# Patient Record
Sex: Female | Born: 1955 | Race: Black or African American | Hispanic: No | Marital: Married | Smoking: Never smoker
Health system: Southern US, Community
[De-identification: ages and names within clinical notes are randomized; demographics above are authoritative.]

## PROBLEM LIST (undated history)

## (undated) DIAGNOSIS — I10 Essential (primary) hypertension: Secondary | ICD-10-CM

## (undated) DIAGNOSIS — E7800 Pure hypercholesterolemia, unspecified: Secondary | ICD-10-CM

## (undated) DIAGNOSIS — K219 Gastro-esophageal reflux disease without esophagitis: Secondary | ICD-10-CM

## (undated) DIAGNOSIS — I639 Cerebral infarction, unspecified: Secondary | ICD-10-CM

## (undated) HISTORY — PX: COLON SURGERY: SHX602

## (undated) HISTORY — PX: COLECTOMY: SHX59

## (undated) HISTORY — PX: BREAST SURGERY: SHX581

## (undated) HISTORY — PX: ABDOMINAL HYSTERECTOMY: SHX81

---

## 2011-06-22 ENCOUNTER — Emergency Department (INDEPENDENT_AMBULATORY_CARE_PROVIDER_SITE_OTHER): Payer: Federal, State, Local not specified - PPO

## 2011-06-22 ENCOUNTER — Emergency Department (HOSPITAL_BASED_OUTPATIENT_CLINIC_OR_DEPARTMENT_OTHER)
Admission: EM | Admit: 2011-06-22 | Discharge: 2011-06-22 | Disposition: A | Discharge status: Home / Self Care | Payer: Federal, State, Local not specified - PPO | Attending: Emergency Medicine | Admitting: Emergency Medicine

## 2011-06-22 DIAGNOSIS — E7800 Pure hypercholesterolemia, unspecified: Secondary | ICD-10-CM | POA: Insufficient documentation

## 2011-06-22 DIAGNOSIS — M79609 Pain in unspecified limb: Secondary | ICD-10-CM

## 2011-06-22 DIAGNOSIS — S90129A Contusion of unspecified lesser toe(s) without damage to nail, initial encounter: Secondary | ICD-10-CM

## 2011-06-22 DIAGNOSIS — E119 Type 2 diabetes mellitus without complications: Secondary | ICD-10-CM | POA: Insufficient documentation

## 2011-06-22 DIAGNOSIS — K219 Gastro-esophageal reflux disease without esophagitis: Secondary | ICD-10-CM | POA: Insufficient documentation

## 2011-06-22 DIAGNOSIS — I10 Essential (primary) hypertension: Secondary | ICD-10-CM | POA: Insufficient documentation

## 2011-06-22 DIAGNOSIS — M7730 Calcaneal spur, unspecified foot: Secondary | ICD-10-CM

## 2011-06-22 DIAGNOSIS — W2203XA Walked into furniture, initial encounter: Secondary | ICD-10-CM

## 2011-06-22 DIAGNOSIS — W2209XA Striking against other stationary object, initial encounter: Secondary | ICD-10-CM | POA: Insufficient documentation

## 2011-06-22 HISTORY — DX: Gastro-esophageal reflux disease without esophagitis: K21.9

## 2011-06-22 HISTORY — DX: Essential (primary) hypertension: I10

## 2011-06-22 HISTORY — DX: Pure hypercholesterolemia, unspecified: E78.00

## 2011-06-22 NOTE — ED Provider Notes (Signed)
History     CSN: [id number] Arrival date & time: 06/22/2011  9:04 PM  Chief Complaint  Patient presents with  . Toe Injury   Patient is a 55 y.o. female presenting with toe pain.  Toe Pain This is a new problem. The current episode started today. The problem occurs constantly. The problem has been unchanged. Associated symptoms include joint swelling. The symptoms are aggravated by nothing. She has tried nothing for the symptoms. The treatment provided moderate relief.  Pt complains of pain to right 4th toe.  Pt reports she hit toe.  Pt complains of bruising and pain.  Past Medical History  Diagnosis Date  . Diabetes mellitus   . Hypertension   . GERD (gastroesophageal reflux disease)   . High cholesterol     Past Surgical History  Procedure Date  . Colon surgery   . Abdominal hysterectomy   . Colectomy   . Breast surgery   . Cesarean section     No family history on file.  History  Substance Use Topics  . Smoking status: Never Smoker   . Smokeless tobacco: Not on file  . Alcohol Use: No    OB History    Grav Para Term Preterm Abortions TAB SAB Ect Mult Living                  Review of Systems  Musculoskeletal: Positive for joint swelling.  All other systems reviewed and are negative.    Physical Exam  BP 142/73  Pulse 102  Temp 98.2 F (36.8 C)  Resp 18  Ht 5\' 5"  (1.651 m)  Wt 199 lb (90.266 kg)  BMI 33.12 kg/m2  SpO2 98%  Physical Exam  Nursing note and vitals reviewed. Constitutional: She appears well-developed.  HENT:  Head: Normocephalic.  Neurological: She is alert.  Skin: There is erythema.  Psychiatric: She has a normal mood and affect.    ED Course  Procedures  MDM  No fx,  Post op shoe,        Langston Masker, [place] 06/22/11 2250

## 2011-06-22 NOTE — ED Notes (Signed)
Stubbed right 4th toe this am

## 2011-06-24 NOTE — ED Provider Notes (Signed)
History/physical exam/procedure(s) were performed by non-physician practitioner and as supervising physician I was immediately available for consultation/collaboration. I have reviewed all notes and am in agreement with care and plan.  Hilario Quarry, MD 06/24/11 [phone]

## 2017-08-02 ENCOUNTER — Encounter (HOSPITAL_BASED_OUTPATIENT_CLINIC_OR_DEPARTMENT_OTHER): Payer: Self-pay | Admitting: *Deleted

## 2017-08-02 ENCOUNTER — Emergency Department (HOSPITAL_BASED_OUTPATIENT_CLINIC_OR_DEPARTMENT_OTHER)
Admission: EM | Admit: 2017-08-02 | Discharge: 2017-08-02 | Disposition: A | Discharge status: Home / Self Care | Payer: Federal, State, Local not specified - PPO | Attending: Emergency Medicine | Admitting: Emergency Medicine

## 2017-08-02 ENCOUNTER — Emergency Department (HOSPITAL_BASED_OUTPATIENT_CLINIC_OR_DEPARTMENT_OTHER): Payer: Federal, State, Local not specified - PPO

## 2017-08-02 DIAGNOSIS — Z794 Long term (current) use of insulin: Secondary | ICD-10-CM | POA: Diagnosis not present

## 2017-08-02 DIAGNOSIS — R42 Dizziness and giddiness: Secondary | ICD-10-CM

## 2017-08-02 DIAGNOSIS — I10 Essential (primary) hypertension: Secondary | ICD-10-CM | POA: Insufficient documentation

## 2017-08-02 DIAGNOSIS — Z7982 Long term (current) use of aspirin: Secondary | ICD-10-CM | POA: Diagnosis not present

## 2017-08-02 DIAGNOSIS — Z8673 Personal history of transient ischemic attack (TIA), and cerebral infarction without residual deficits: Secondary | ICD-10-CM | POA: Diagnosis not present

## 2017-08-02 DIAGNOSIS — Z7902 Long term (current) use of antithrombotics/antiplatelets: Secondary | ICD-10-CM | POA: Insufficient documentation

## 2017-08-02 DIAGNOSIS — Z79899 Other long term (current) drug therapy: Secondary | ICD-10-CM | POA: Insufficient documentation

## 2017-08-02 DIAGNOSIS — E119 Type 2 diabetes mellitus without complications: Secondary | ICD-10-CM | POA: Insufficient documentation

## 2017-08-02 HISTORY — DX: Cerebral infarction, unspecified: I63.9

## 2017-08-02 LAB — CBC WITH DIFFERENTIAL/PLATELET
BASOS ABS: 0 10*3/uL (ref 0.0–0.1)
Basophils Relative: 0 %
EOS PCT: 4 %
Eosinophils Absolute: 0.3 10*3/uL (ref 0.0–0.7)
HEMATOCRIT: 40.3 % (ref 36.0–46.0)
HEMOGLOBIN: 12.8 g/dL (ref 12.0–15.0)
LYMPHS ABS: 3 10*3/uL (ref 0.7–4.0)
LYMPHS PCT: 48 %
MCH: 27.5 pg (ref 26.0–34.0)
MCHC: 31.8 g/dL (ref 30.0–36.0)
MCV: 86.7 fL (ref 78.0–100.0)
MONOS PCT: 10 %
Monocytes Absolute: 0.6 10*3/uL (ref 0.1–1.0)
Neutro Abs: 2.4 10*3/uL (ref 1.7–7.7)
Neutrophils Relative %: 38 %
Platelets: 211 10*3/uL (ref 150–400)
RBC: 4.65 MIL/uL (ref 3.87–5.11)
RDW: 14.3 % (ref 11.5–15.5)
WBC: 6.2 10*3/uL (ref 4.0–10.5)

## 2017-08-02 LAB — BASIC METABOLIC PANEL
ANION GAP: 6 (ref 5–15)
BUN: 11 mg/dL (ref 6–20)
CALCIUM: 9.2 mg/dL (ref 8.9–10.3)
CHLORIDE: 106 mmol/L (ref 101–111)
CO2: 26 mmol/L (ref 22–32)
Creatinine, Ser: 0.61 mg/dL (ref 0.44–1.00)
GFR calc non Af Amer: >60 mL/min (ref >60)
Glucose, Bld: 161 mg/dL — ABNORMAL HIGH (ref 65–99)
Potassium: 3.4 mmol/L — ABNORMAL LOW (ref 3.5–5.1)
Sodium: 138 mmol/L (ref 135–145)

## 2017-08-02 MED ORDER — IBUPROFEN 400 MG PO TABS: 400.0000 mg | ORAL_TABLET | Freq: Four times a day (QID) | ORAL | 0 refills | Status: DC | PRN

## 2017-08-02 MED ORDER — MECLIZINE HCL 25 MG PO TABS: 25.0000 mg | ORAL_TABLET | Freq: Three times a day (TID) | ORAL | 0 refills | Status: DC | PRN

## 2017-08-02 NOTE — ED Triage Notes (Addendum)
Dizziness for a week. Head pressure off and on x 3 weeks. States it reminds her of same pressure she had years ago after repressed anger.

## 2017-08-02 NOTE — ED Provider Notes (Signed)
[facility] EMERGENCY DEPARTMENT Provider Note   CSN: [id number] Arrival date & time: 08/02/17  9562   History   Chief Complaint Chief Complaint  Patient presents with  . Dizziness    HPI Quintana Canelo is a 61 y.o. female.  HPI Sx started last Wednesday after getting up from yoga class.  She started to become dizzy.   It increases with certain movements.  She also has had a tightness in her head for the last several weeks. It feels like someone is holding her hair.  Pt has history of a prior "blood clot" in her head so she became concerned.  She tried some tylenol today when the tightness increased so she came to the ED. She has noticed some muffled hearing as well.   Past Medical History:  Diagnosis Date  . Diabetes mellitus   . GERD (gastroesophageal reflux disease)   . High cholesterol   . Hypertension   . Stroke [facility])     There are no active problems to display for this patient.   Past Surgical History:  Procedure Laterality Date  . ABDOMINAL HYSTERECTOMY    . BREAST SURGERY    . CESAREAN SECTION    . COLECTOMY    . COLON SURGERY      OB History    No data available       Home Medications    Prior to Admission medications   Medication Sig Start Date End Date Taking? Authorizing Provider  amLODipine (NORVASC) 5 MG tablet Take 5 mg by mouth at bedtime.     Yes [provider]  atorvastatin (LIPITOR) 20 MG tablet Take 40 mg by mouth daily.    Yes [provider]  benazepril-hydrochlorthiazide (LOTENSIN HCT) 20-12.5 MG per tablet Take 1 tablet by mouth daily.     Yes [provider]  butalbital-aspirin-caffeine Benny Lennert) 50-325-40 MG tablet Take 1 tablet by mouth 2 (two) times daily as needed for headache.   Yes [provider]  cholecalciferol (VITAMIN D) 1000 UNITS tablet Take 1,000 Units by mouth daily.     Yes [provider]  Clopidogrel Bisulfate (PLAVIX PO) Take by mouth.   Yes [provider]  Dapagliflozin Propanediol (FARXIGA PO) Take by mouth.   Yes [provider]  esomeprazole (NEXIUM) 40 MG capsule Take 40 mg by mouth daily before breakfast.     Yes [provider]  fexofenadine (ALLEGRA) 180 MG tablet Take 180 mg by mouth daily.     Yes [provider]  insulin glargine (LANTUS) 100 UNIT/ML injection Inject 46 Units into the skin at bedtime.     Yes [provider]  metFORMIN (GLUCOPHAGE) 1000 MG tablet Take 1,000 mg by mouth 2 (two) times daily with a meal.     Yes [provider]  potassium chloride (KLOR-CON) 10 MEQ CR tablet Take 10 mEq by mouth daily.     Yes [provider]  Semaglutide (OZEMPIC [place]) Inject into the skin.   Yes [provider]  valACYclovir (VALTREX) 500 MG tablet Take 500 mg by mouth daily.     Yes [provider]  yellow fever (YF-VAX) injection Inject 0.5 mLs into the skin once.   Yes [provider]  aspirin 81 MG tablet Take 81 mg by mouth daily.      [provider]  ibuprofen (ADVIL,MOTRIN) 400 MG tablet Take 1 tablet (400 mg total) by mouth every 6 (six) hours as needed. 08/02/17   Lynelle Doctor,  Cletis Athens, MD  insulin aspart (NOVOLOG) 100 UNIT/ML injection Inject 20 Units into the skin 3 (three) times daily before meals.      [provider]  meclizine (ANTIVERT) 25 MG tablet Take 1 tablet (25 mg total) by mouth 3 (three) times daily as needed for dizziness. 08/02/17   Linwood Dibbles, MD    Family History No family history on file.  Social History Social History  Substance Use Topics  . Smoking status: Never Smoker  . Smokeless tobacco: Never Used  . Alcohol use No     Allergies   Fish allergy   Review of Systems Review of Systems  All other systems reviewed and are negative.    Physical Exam Updated Vital Signs BP 139/82 (BP Location: Left Arm)   Pulse 80   Temp 97.9 F (36.6 C) (Oral)   Resp 20   Ht 1.651 m (5\' 5" )   Wt 79.4  kg (175 lb)   SpO2 94%   BMI 29.12 kg/m   Physical Exam  Constitutional: She is oriented to person, place, and time. She appears well-developed and well-nourished. No distress.  HENT:  Head: Normocephalic and atraumatic.  Right Ear: External ear normal.  Left Ear: External ear normal.  Mouth/Throat: Oropharynx is clear and moist.  Eyes: Conjunctivae are normal. Right eye exhibits no discharge. Left eye exhibits no discharge. No scleral icterus.  Neck: Neck supple. No tracheal deviation present.  Cardiovascular: Normal rate, regular rhythm and intact distal pulses.   Pulmonary/Chest: Effort normal and breath sounds normal. No stridor. No respiratory distress. She has no wheezes. She has no rales.  Abdominal: Soft. Bowel sounds are normal. She exhibits no distension. There is no tenderness. There is no rebound and no guarding.  Musculoskeletal: She exhibits no edema or tenderness.  Neurological: She is alert and oriented to person, place, and time. She has normal strength. No cranial nerve deficit (No facial droop, extraocular movements intact, tongue midline ) or sensory deficit. She exhibits normal muscle tone. She displays no seizure activity. Coordination normal.  No pronator drift bilateral upper extrem, able to hold both legs off bed for 5 seconds, sensation intact in all extremities, no visual field cuts, no left or right sided neglect, normal finger-nose exam bilaterally, no nystagmus noted   Skin: Skin is warm and dry. No rash noted.  Psychiatric: She has a normal mood and affect.  Nursing note and vitals reviewed.    ED Treatments / Results  Labs (all labs ordered are listed, but only abnormal results are displayed) Labs Reviewed  BASIC METABOLIC PANEL - Abnormal; Notable for the following:       Result Value   Potassium 3.4 (*)    Glucose, Bld 161 (*)    All other components within normal limits  CBC WITH DIFFERENTIAL/PLATELET     Radiology Ct Head Wo  Contrast  Result Date: 08/02/2017 CLINICAL DATA:  Dizziness for 1 week EXAM: CT HEAD WITHOUT CONTRAST TECHNIQUE: Contiguous axial images were obtained from the base of the skull through the vertex without intravenous contrast. COMPARISON:  04/19/2017 FINDINGS: Brain: No acute territorial infarction, hemorrhage or intracranial mass is visualized. 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 Normal ventricle size. Minimal small vessel ischemic changes of the white matter. Vascular: No hyperdense vessels. Skull: No fracture or suspicious bone lesion Sinuses/Orbits: No acute finding. Other: None IMPRESSION: No CT evidence for acute intracranial abnormality. Electronically Signed   By: Jasmine Pang M.D.   On: 08/02/2017 22:05  Procedures Procedures (including critical care time)  Medications Ordered in ED Medications - No data to display   Initial Impression / Assessment and Plan / ED Course  I have reviewed the triage vital signs and the nursing notes.  Pertinent labs & imaging results that were available during my care of the patient were reviewed by me and considered in my medical decision making (see chart for details).   Patient presented to the emergency room for evaluation of dizziness.  Some of the symptoms suggest the possibility of a peripheral etiology.  However the patient did have a complaint of headache and she had a history of a prior stroke.  CT scan does not show any acute abnormalities.  Discussed over-the-counter medications for her headache.  PRN meclizine . Did discuss the limitations of the CT scan but fortunately I do not see any neurologic abnormalities on my exam.  If her symptoms are not improving she should follow-up for further evaluation  Final Clinical Impressions(s) / ED Diagnoses   Final diagnoses:  Dizziness    New Prescriptions New Prescriptions   IBUPROFEN (ADVIL,MOTRIN) 400 MG TABLET    Take 1 tablet (400 mg total) by mouth every 6 (six) hours as needed.    MECLIZINE (ANTIVERT) 25 MG TABLET    Take 1 tablet (25 mg total) by mouth 3 (three) times daily as needed for dizziness.     Linwood DibblesKnapp, Toshie Demelo, MD 08/02/17 2325

## 2017-08-02 NOTE — ED Notes (Signed)
After doing exercising last Wednesday got dizzy and having pressure and tightness to top of head  Denies pain

## 2017-08-02 NOTE — Discharge Instructions (Signed)
You can try taking the meclizine to see if it helps with your dizziness.  Follow-up with your primary care doctor for symptoms not resolved over the next few days.

## 2017-09-07 ENCOUNTER — Encounter (HOSPITAL_BASED_OUTPATIENT_CLINIC_OR_DEPARTMENT_OTHER): Payer: Self-pay | Admitting: Emergency Medicine

## 2017-09-07 ENCOUNTER — Emergency Department (HOSPITAL_BASED_OUTPATIENT_CLINIC_OR_DEPARTMENT_OTHER): Payer: Federal, State, Local not specified - PPO

## 2017-09-07 ENCOUNTER — Emergency Department (HOSPITAL_BASED_OUTPATIENT_CLINIC_OR_DEPARTMENT_OTHER)
Admission: EM | Admit: 2017-09-07 | Discharge: 2017-09-07 | Disposition: A | Discharge status: Home / Self Care | Payer: Federal, State, Local not specified - PPO | Attending: Physician Assistant | Admitting: Physician Assistant

## 2017-09-07 ENCOUNTER — Other Ambulatory Visit: Payer: Self-pay

## 2017-09-07 DIAGNOSIS — I10 Essential (primary) hypertension: Secondary | ICD-10-CM | POA: Diagnosis not present

## 2017-09-07 DIAGNOSIS — Z8673 Personal history of transient ischemic attack (TIA), and cerebral infarction without residual deficits: Secondary | ICD-10-CM | POA: Diagnosis not present

## 2017-09-07 DIAGNOSIS — Z794 Long term (current) use of insulin: Secondary | ICD-10-CM | POA: Insufficient documentation

## 2017-09-07 DIAGNOSIS — R079 Chest pain, unspecified: Secondary | ICD-10-CM

## 2017-09-07 DIAGNOSIS — R0789 Other chest pain: Secondary | ICD-10-CM | POA: Diagnosis not present

## 2017-09-07 DIAGNOSIS — E119 Type 2 diabetes mellitus without complications: Secondary | ICD-10-CM | POA: Insufficient documentation

## 2017-09-07 DIAGNOSIS — Z79899 Other long term (current) drug therapy: Secondary | ICD-10-CM | POA: Diagnosis not present

## 2017-09-07 LAB — CBC WITH DIFFERENTIAL/PLATELET
BASOS ABS: 0 10*3/uL (ref 0.0–0.1)
BASOS PCT: 0 %
EOS PCT: 5 %
Eosinophils Absolute: 0.3 10*3/uL (ref 0.0–0.7)
HCT: 39 % (ref 36.0–46.0)
Hemoglobin: 12.4 g/dL (ref 12.0–15.0)
Lymphocytes Relative: 44 %
Lymphs Abs: 2.2 10*3/uL (ref 0.7–4.0)
MCH: 27.9 pg (ref 26.0–34.0)
MCHC: 31.8 g/dL (ref 30.0–36.0)
MCV: 87.8 fL (ref 78.0–100.0)
MONOS PCT: 9 %
Monocytes Absolute: 0.5 10*3/uL (ref 0.1–1.0)
Neutro Abs: 2.1 10*3/uL (ref 1.7–7.7)
Neutrophils Relative %: 42 %
PLATELETS: 205 10*3/uL (ref 150–400)
RBC: 4.44 MIL/uL (ref 3.87–5.11)
RDW: 14.2 % (ref 11.5–15.5)
WBC: 4.9 10*3/uL (ref 4.0–10.5)

## 2017-09-07 LAB — D-DIMER, QUANTITATIVE: D-Dimer, Quant: <0.27 ug/mL-FEU (ref 0.00–0.50)

## 2017-09-07 LAB — BASIC METABOLIC PANEL
Anion gap: 10 (ref 5–15)
BUN: 8 mg/dL (ref 6–20)
CALCIUM: 9.4 mg/dL (ref 8.9–10.3)
CO2: 25 mmol/L (ref 22–32)
CREATININE: 0.64 mg/dL (ref 0.44–1.00)
Chloride: 105 mmol/L (ref 101–111)
GLUCOSE: 127 mg/dL — AB (ref 65–99)
Potassium: 3.5 mmol/L (ref 3.5–5.1)
Sodium: 140 mmol/L (ref 135–145)

## 2017-09-07 LAB — TROPONIN I

## 2017-09-07 MED ORDER — METHOCARBAMOL 500 MG PO TABS: 500.0000 mg | ORAL_TABLET | Freq: Every evening | ORAL | 0 refills | Status: DC | PRN

## 2017-09-07 NOTE — ED Provider Notes (Signed)
[facility] EMERGENCY DEPARTMENT Provider Note   CSN: [id number] Arrival date & time: 09/07/17  1014     History   Chief Complaint Chief Complaint  Patient presents with  . Chest Pain    HPI Jackie Phillips is a 61 y.o. female with past medical history significant for diabetes, hypertension and stroke presenting with sudden onset left-sided pleuritic chest pain worse with deep breaths and movement. She states that she is pain free without movement as she sits in bed right now. She reports that the pain is underneath her left breast and travels to her left shoulder blade. She attempted to open blinds this morning and stepped into the bathtub to do so and overstretched her arm out. She used her right hand and didn't think that she pulled a muscle on the left side. She denies any cough, fever, chills, congestion, Denies any history of DVT/PE, estrogen use, leg swelling, calf pain, she did travel on the bus for 6 hours yesterday,no recent surgeries, no cough or hemoptysis. Currently followed by cardiology and has a implanted Holter monitor since October 31st.   HPI  Past Medical History:  Diagnosis Date  . Diabetes mellitus   . GERD (gastroesophageal reflux disease)   . High cholesterol   . Hypertension   . Stroke [facility])     There are no active problems to display for this patient.   Past Surgical History:  Procedure Laterality Date  . ABDOMINAL HYSTERECTOMY    . BREAST SURGERY    . CESAREAN SECTION    . COLECTOMY    . COLON SURGERY      OB History    No data available       Home Medications    Prior to Admission medications   Medication Sig Start Date End Date Taking? Authorizing Provider  amLODipine (NORVASC) 5 MG tablet Take 5 mg by mouth at bedtime.      [provider]  aspirin 81 MG tablet Take 81 mg by mouth daily.      [provider]  atorvastatin (LIPITOR) 20 MG tablet Take 40 mg by mouth daily.     [provider]    benazepril-hydrochlorthiazide (LOTENSIN HCT) 20-12.5 MG per tablet Take 1 tablet by mouth daily.      [provider]  butalbital-aspirin-caffeine Benny Lennert) 50-325-40 MG tablet Take 1 tablet by mouth 2 (two) times daily as needed for headache.    [provider]  cholecalciferol (VITAMIN D) 1000 UNITS tablet Take 1,000 Units by mouth daily.      [provider]  Clopidogrel Bisulfate (PLAVIX PO) Take by mouth.    [provider]  Dapagliflozin Propanediol (FARXIGA PO) Take by mouth.    [provider]  esomeprazole (NEXIUM) 40 MG capsule Take 40 mg by mouth daily before breakfast.      [provider]  fexofenadine (ALLEGRA) 180 MG tablet Take 180 mg by mouth daily.      [provider]  ibuprofen (ADVIL,MOTRIN) 400 MG tablet Take 1 tablet (400 mg total) by mouth every 6 (six) hours as needed. 08/02/17   Linwood Dibbles, MD  insulin aspart (NOVOLOG) 100 UNIT/ML injection Inject 20 Units into the skin 3 (three) times daily before meals.      [provider]  insulin glargine (LANTUS) 100 UNIT/ML injection Inject 46 Units into the skin at bedtime.      [provider]  meclizine (ANTIVERT) 25 MG tablet Take 1 tablet (25 mg total)  by mouth 3 (three) times daily as needed for dizziness. 08/02/17   Linwood DibblesKnapp, Jon, MD  metFORMIN (GLUCOPHAGE) 1000 MG tablet Take 1,000 mg by mouth 2 (two) times daily with a meal.      [provider]  methocarbamol (ROBAXIN) 500 MG tablet Take 1 tablet (500 mg total) by mouth at bedtime as needed for muscle spasms. 09/07/17   Mathews RobinsonsMitchell, Bari Handshoe B, PA-C  potassium chloride (KLOR-CON) 10 MEQ CR tablet Take 10 mEq by mouth daily.      [provider]  Semaglutide (OZEMPIC [place]) Inject into the skin.    [provider]  valACYclovir (VALTREX) 500 MG tablet Take 500 mg by mouth daily.      [provider]  yellow fever (YF-VAX) injection Inject 0.5 mLs into the skin once.     [provider]    Family History No family history on file.  Social History Social History   Tobacco Use  . Smoking status: Never Smoker  . Smokeless tobacco: Never Used  Substance Use Topics  . Alcohol use: No  . Drug use: No     Allergies   Codeine and Fish allergy   Review of Systems Review of Systems  Constitutional: Negative for chills, diaphoresis, fatigue and fever.  Respiratory: Negative for cough, choking, chest tightness, shortness of breath, wheezing and stridor.   Cardiovascular: Positive for chest pain. Negative for palpitations.       Pleuritic chest pain and with movement only  Gastrointestinal: Negative for abdominal distention, abdominal pain, diarrhea, nausea and vomiting.  Musculoskeletal: Negative for arthralgias, back pain, myalgias, neck pain and neck stiffness.  Skin: Negative for color change, pallor and rash.  Neurological: Negative for dizziness, seizures, syncope, facial asymmetry, weakness, light-headedness, numbness and headaches.       Patient reports residual left sided numbness in her fingers and intermittent since CVA which is baseline for her.     Physical Exam Updated Vital Signs BP 129/78 (BP Location: Left Arm)   Pulse 87   Temp 97.7 F (36.5 C) (Oral)   Resp 16   Ht 5\' 5"  (1.651 m)   Wt 79.4 kg (175 lb)   SpO2 95%   BMI 29.12 kg/m   Physical Exam  Constitutional: She is oriented to person, place, and time. She appears well-developed and well-nourished.  Non-toxic appearance. She does not appear ill. No distress.  Afebrile, nontoxic-appearing, sitting comfortably in bed in no acute distress.  HENT:  Head: Normocephalic and atraumatic.  Eyes: Conjunctivae and EOM are normal.  Neck: Normal range of motion. No JVD present.  Cardiovascular: Normal rate, regular rhythm and normal pulses.  No murmur heard. Pulmonary/Chest: Effort normal and breath sounds normal. No stridor. No respiratory distress. She has no  decreased breath sounds.  Lungs CTA bilaterally, no tenderness to palpation of the chest wall  Abdominal: She exhibits no distension.  Musculoskeletal: Normal range of motion. She exhibits no edema.       Right lower leg: Normal. She exhibits no tenderness and no edema.       Left lower leg: Normal. She exhibits no tenderness and no edema.  Neurological: She is alert and oriented to person, place, and time.  Skin: Skin is warm and dry. No rash noted. She is not diaphoretic. No erythema. No pallor.  Psychiatric: She has a normal mood and affect.  Nursing note and vitals reviewed.    ED Treatments / Results  Labs (all labs ordered are listed, but only abnormal  results are displayed) Labs Reviewed  BASIC METABOLIC PANEL - Abnormal; Notable for the following components:      Result Value   Glucose, Bld 127 (*)    All other components within normal limits  TROPONIN I  CBC WITH DIFFERENTIAL/PLATELET  D-DIMER, QUANTITATIVE (NOT AT [facility])  TROPONIN I    EKG  EKG Interpretation  Date/Time:  Tuesday September 07 2017 13:53:10 EST Ventricular Rate:  83 PR Interval:    QRS Duration: 89 QT Interval:  410 QTC Calculation: 482 R Axis:   7 Text Interpretation:  Sinus rhythm Abnormal R-wave progression, early transition No significant change since last tracing Confirmed by Bary CastillaMackuen, Courteney ([id number]) on 09/07/2017 2:01:08 PM Also confirmed by Corlis LeakMackuen, Courteney ([id number]), editor Barbette Hairassel, Kerry [phone]  on 09/07/2017 4:04:32 PM       Radiology Dg Chest 2 View  Result Date: 09/07/2017 CLINICAL DATA:  Right-sided chest pain today EXAM: CHEST  2 VIEW COMPARISON:  Chest x-ray of 11/30/2016 FINDINGS: The lungs are not optimally aerated but no pneumonia or effusion is seen. There is some prominence of perihilar markings with peribronchial thickening which could indicate bronchitis. Mediastinal and hilar contours are otherwise unremarkable. The heart is within normal limits in size. No acute bony  abnormality is seen. IMPRESSION: 1. Poor inspiration. 2. Peribronchial thickening may indicate bronchitis. No pneumonia or effusion is seen. Electronically Signed   By: Dwyane DeePaul  Barry M.D.   On: 09/07/2017 11:08    Procedures Procedures (including critical care time)  Medications Ordered in ED Medications - No data to display   Initial Impression / Assessment and Plan / ED Course  I have reviewed the triage vital signs and the nursing notes.  Pertinent labs & imaging results that were available during my care of the patient were reviewed by me and considered in my medical decision making (see chart for details).    Presenting with sudden onset left-sided sharp chest pain radiating to left shoulder without pleuritic component and aggravated by movement. No exertional component.  Patient was pain-free while sitting still, no associated shortness of breath, nausea, vomiting, diaphoresis.  She has not been experiencing chest pain or shortness of breath on exertion.  Workup unremarkable Chest x-ray with peribronchial thickening possible bronchitis. She has no upper respiratory symptoms or cough. She is well-appearing, nontoxic afebrile.  Delta troponin negative, unchanged EKG Heart score : 2 Negative dimer Wells criteria score: 0  Patient reported overall improvement in her symptoms during her stay in the emergency department.  Will discharge home with symptomatic relief and close follow-up with PCP and cardiology as scheduled.  Discussed strict return precautions and advised to return to the emergency department if experiencing any new or worsening symptoms. Instructions were understood and patient agreed with discharge plan.  Final Clinical Impressions(s) / ED Diagnoses   Final diagnoses:  Nonspecific chest pain    ED Discharge Orders        Ordered    methocarbamol (ROBAXIN) 500 MG tablet  At bedtime PRN     09/07/17 1448       Georgiana ShoreMitchell, Juanna Pudlo B, PA-C 09/07/17 1659     Mackuen, Cindee Saltourteney Lyn, MD 09/11/17 [phone]

## 2017-09-07 NOTE — Discharge Instructions (Signed)
As discussed, your labs and imaging were very reassuring today. Follow up with your Primary Care provider and cardiologist as scheduled.  Take robaxin as needed for muscle spasm. Do not drive or operate machinery while on this medication. Tylenol as needed.  Return if symptoms worsen or new concerning symptoms in the meantime.

## 2017-09-07 NOTE — ED Notes (Signed)
Patient transported to X-ray 

## 2017-09-07 NOTE — ED Triage Notes (Signed)
Pt c/o LT CP with radiation to LT shoulder and back intermittently since 0830; sts increases with breathing and movement

## 2018-10-29 ENCOUNTER — Other Ambulatory Visit: Payer: Self-pay

## 2018-10-29 ENCOUNTER — Emergency Department (HOSPITAL_BASED_OUTPATIENT_CLINIC_OR_DEPARTMENT_OTHER): Payer: Federal, State, Local not specified - PPO

## 2018-10-29 ENCOUNTER — Encounter (HOSPITAL_BASED_OUTPATIENT_CLINIC_OR_DEPARTMENT_OTHER): Payer: Self-pay | Admitting: Emergency Medicine

## 2018-10-29 ENCOUNTER — Emergency Department (HOSPITAL_BASED_OUTPATIENT_CLINIC_OR_DEPARTMENT_OTHER)
Admission: EM | Admit: 2018-10-29 | Discharge: 2018-10-29 | Disposition: A | Discharge status: Home / Self Care | Payer: Federal, State, Local not specified - PPO | Attending: Emergency Medicine | Admitting: Emergency Medicine

## 2018-10-29 DIAGNOSIS — Y9389 Activity, other specified: Secondary | ICD-10-CM | POA: Insufficient documentation

## 2018-10-29 DIAGNOSIS — S39011A Strain of muscle, fascia and tendon of abdomen, initial encounter: Secondary | ICD-10-CM | POA: Insufficient documentation

## 2018-10-29 DIAGNOSIS — Z79899 Other long term (current) drug therapy: Secondary | ICD-10-CM | POA: Diagnosis not present

## 2018-10-29 DIAGNOSIS — Z7902 Long term (current) use of antithrombotics/antiplatelets: Secondary | ICD-10-CM | POA: Insufficient documentation

## 2018-10-29 DIAGNOSIS — R109 Unspecified abdominal pain: Secondary | ICD-10-CM

## 2018-10-29 DIAGNOSIS — Z794 Long term (current) use of insulin: Secondary | ICD-10-CM | POA: Diagnosis not present

## 2018-10-29 DIAGNOSIS — Y929 Unspecified place or not applicable: Secondary | ICD-10-CM | POA: Insufficient documentation

## 2018-10-29 DIAGNOSIS — Y999 Unspecified external cause status: Secondary | ICD-10-CM | POA: Diagnosis not present

## 2018-10-29 DIAGNOSIS — T148XXA Other injury of unspecified body region, initial encounter: Secondary | ICD-10-CM

## 2018-10-29 DIAGNOSIS — R05 Cough: Secondary | ICD-10-CM | POA: Diagnosis not present

## 2018-10-29 DIAGNOSIS — Z8673 Personal history of transient ischemic attack (TIA), and cerebral infarction without residual deficits: Secondary | ICD-10-CM | POA: Insufficient documentation

## 2018-10-29 DIAGNOSIS — R1032 Left lower quadrant pain: Secondary | ICD-10-CM | POA: Insufficient documentation

## 2018-10-29 DIAGNOSIS — X58XXXA Exposure to other specified factors, initial encounter: Secondary | ICD-10-CM | POA: Insufficient documentation

## 2018-10-29 DIAGNOSIS — S3991XA Unspecified injury of abdomen, initial encounter: Secondary | ICD-10-CM | POA: Diagnosis present

## 2018-10-29 DIAGNOSIS — E119 Type 2 diabetes mellitus without complications: Secondary | ICD-10-CM | POA: Insufficient documentation

## 2018-10-29 DIAGNOSIS — I10 Essential (primary) hypertension: Secondary | ICD-10-CM | POA: Insufficient documentation

## 2018-10-29 DIAGNOSIS — R059 Cough, unspecified: Secondary | ICD-10-CM

## 2018-10-29 LAB — URINALYSIS, ROUTINE W REFLEX MICROSCOPIC
Bilirubin Urine: NEGATIVE
Glucose, UA: NEGATIVE mg/dL
Ketones, ur: NEGATIVE mg/dL
LEUKOCYTES UA: NEGATIVE
NITRITE: NEGATIVE
PH: 6.5 (ref 5.0–8.0)
Protein, ur: 100 mg/dL — AB
SPECIFIC GRAVITY, URINE: 1.015 (ref 1.005–1.030)

## 2018-10-29 LAB — COMPREHENSIVE METABOLIC PANEL
ALBUMIN: 4.3 g/dL (ref 3.5–5.0)
ALT: 13 U/L (ref 0–44)
ANION GAP: 8 (ref 5–15)
AST: 16 U/L (ref 15–41)
Alkaline Phosphatase: 57 U/L (ref 38–126)
BUN: 16 mg/dL (ref 8–23)
CHLORIDE: 104 mmol/L (ref 98–111)
CO2: 26 mmol/L (ref 22–32)
Calcium: 9.3 mg/dL (ref 8.9–10.3)
Creatinine, Ser: 1.44 mg/dL — ABNORMAL HIGH (ref 0.44–1.00)
GFR calc Af Amer: 44 mL/min — ABNORMAL LOW (ref >60)
GFR calc non Af Amer: 38 mL/min — ABNORMAL LOW (ref >60)
GLUCOSE: 128 mg/dL — AB (ref 70–99)
POTASSIUM: 3.7 mmol/L (ref 3.5–5.1)
SODIUM: 138 mmol/L (ref 135–145)
Total Bilirubin: 0.3 mg/dL (ref 0.3–1.2)
Total Protein: 7.6 g/dL (ref 6.5–8.1)

## 2018-10-29 LAB — CBC WITH DIFFERENTIAL/PLATELET
Abs Immature Granulocytes: 0.02 10*3/uL (ref 0.00–0.07)
BASOS ABS: 0.1 10*3/uL (ref 0.0–0.1)
BASOS PCT: 1 %
EOS ABS: 1.5 10*3/uL — AB (ref 0.0–0.5)
EOS PCT: 18 %
HCT: 38.1 % (ref 36.0–46.0)
Hemoglobin: 11.8 g/dL — ABNORMAL LOW (ref 12.0–15.0)
IMMATURE GRANULOCYTES: 0 %
LYMPHS ABS: 2.8 10*3/uL (ref 0.7–4.0)
Lymphocytes Relative: 34 %
MCH: 28.9 pg (ref 26.0–34.0)
MCHC: 31 g/dL (ref 30.0–36.0)
MCV: 93.2 fL (ref 80.0–100.0)
Monocytes Absolute: 0.8 10*3/uL (ref 0.1–1.0)
Monocytes Relative: 9 %
NEUTROS PCT: 38 %
NRBC: 0 % (ref 0.0–0.2)
Neutro Abs: 3.1 10*3/uL (ref 1.7–7.7)
PLATELETS: 218 10*3/uL (ref 150–400)
RBC: 4.09 MIL/uL (ref 3.87–5.11)
RDW: 12.5 % (ref 11.5–15.5)
WBC: 8.2 10*3/uL (ref 4.0–10.5)

## 2018-10-29 LAB — URINALYSIS, MICROSCOPIC (REFLEX)

## 2018-10-29 LAB — CK: CK TOTAL: 281 U/L — AB (ref 38–234)

## 2018-10-29 LAB — TROPONIN I: Troponin I: <0.03 ng/mL (ref <0.03)

## 2018-10-29 MED ORDER — CYCLOBENZAPRINE HCL 5 MG PO TABS: 5.0000 mg | ORAL_TABLET | Freq: Three times a day (TID) | ORAL | 0 refills | Status: AC | PRN

## 2018-10-29 MED ORDER — CYCLOBENZAPRINE HCL 5 MG PO TABS
5.0000 mg | ORAL_TABLET | Freq: Once | ORAL | Status: AC
  Administered 2018-10-29: 5 mg via ORAL
  Filled 2018-10-29: qty 1

## 2018-10-29 MED ORDER — SODIUM CHLORIDE 0.9 % IV BOLUS
1000.0000 mL | Freq: Once | INTRAVENOUS | Status: AC
  Administered 2018-10-29: 1000 mL via INTRAVENOUS

## 2018-10-29 NOTE — Discharge Instructions (Signed)
Take flexeril as needed for muscle spasms.   Avoid heavy lifting.   You may have tylenol, motrin for pain. Don't take too much motrin as it may affect your kidney function   See your doctor  Return to ER if you have worse flank pain, vomiting, fever, chest pain.

## 2018-10-29 NOTE — ED Triage Notes (Signed)
Pt c/o left sided flank pain onset yesterday. Pt denies N/V, urinary symptoms, injury or recent strenuous activity.

## 2018-10-29 NOTE — ED Provider Notes (Signed)
[facility] EMERGENCY DEPARTMENT Provider Note   CSN: [id number] Arrival date & time: 10/29/18  0848     History   Chief Complaint Chief Complaint  Patient presents with  . Flank Pain    HPI Jackie SpareBrenda Chasteen is a 63 y.o. female history of diabetes on insulin pump, high cholesterol, hypertension, previous stroke here presenting with left-sided flank pain, cough.  Patient states that she had some nonproductive cough since yesterday.  She noticed some left flank pain after she coughs.  She states that the pain is also worse when she moves around but denies any nausea vomiting or urinary symptoms.  Denies any fall or trauma to the back. Denies any previous history of kidney stones.  The history is provided by the patient.    Past Medical History:  Diagnosis Date  . Diabetes mellitus   . GERD (gastroesophageal reflux disease)   . High cholesterol   . Hypertension   . Stroke [facility](HCC)     There are no active problems to display for this patient.   Past Surgical History:  Procedure Laterality Date  . ABDOMINAL HYSTERECTOMY    . BREAST SURGERY    . CESAREAN SECTION    . COLECTOMY    . COLON SURGERY       OB History   No obstetric history on file.      Home Medications    Prior to Admission medications   Medication Sig Start Date End Date Taking? Authorizing Provider  benazepril (LOTENSIN) 20 MG tablet TAKE 1 TABLET BY MOUTH EVERY DAY 09/28/15  Yes [provider]  amLODipine (NORVASC) 5 MG tablet Take 5 mg by mouth at bedtime.      [provider]  atorvastatin (LIPITOR) 20 MG tablet Take 40 mg by mouth daily.     [provider]  cholecalciferol (VITAMIN D) 1000 UNITS tablet Take 1,000 Units by mouth daily.      [provider]  Clopidogrel Bisulfate (PLAVIX PO) Take by mouth.    [provider]  Dapagliflozin Propanediol (FARXIGA PO) Take by mouth.    [provider]  esomeprazole (NEXIUM) 40 MG capsule  Take 40 mg by mouth daily before breakfast.      [provider]  insulin aspart (NOVOLOG) 100 UNIT/ML injection Inject 20 Units into the skin 3 (three) times daily before meals.      [provider]  metFORMIN (GLUCOPHAGE) 1000 MG tablet Take 1,000 mg by mouth 2 (two) times daily with a meal.      [provider]  potassium chloride (KLOR-CON) 10 MEQ CR tablet Take 10 mEq by mouth daily.      [provider]  Semaglutide (OZEMPIC [place]) Inject into the skin.    [provider]  valACYclovir (VALTREX) 500 MG tablet Take 500 mg by mouth daily.      [provider]    Family History No family history on file.  Social History Social History   Tobacco Use  . Smoking status: Never Smoker  . Smokeless tobacco: Never Used  Substance Use Topics  . Alcohol use: No  . Drug use: No     Allergies   Codeine and Fish allergy   Review of Systems Review of Systems  Genitourinary: Positive for flank pain.  All other systems reviewed and are negative.    Physical Exam Updated Vital Signs BP 121/62   Pulse 79   Temp 97.6 F (36.4 C) (Oral)   Resp 15  Ht 5\' 5"  (1.651 m)   Wt 81.6 kg   SpO2 96%   BMI 29.95 kg/m   Physical Exam Vitals signs and nursing note reviewed.  Constitutional:      Appearance: Normal appearance.  HENT:     Head: Normocephalic.     Nose: Nose normal.     Mouth/Throat:     Mouth: Mucous membranes are moist.  Eyes:     Extraocular Movements: Extraocular movements intact.     Pupils: Pupils are equal, round, and reactive to light.  Neck:     Musculoskeletal: Normal range of motion.  Cardiovascular:     Rate and Rhythm: Normal rate and regular rhythm.     Pulses: Normal pulses.     Heart sounds: Normal heart sounds.  Pulmonary:     Effort: Pulmonary effort is normal.     Breath sounds: Normal breath sounds.  Abdominal:     General: Abdomen is flat.     Palpations: Abdomen is soft.     Comments:  Mild L CVAT vs paralumbar tenderness   Musculoskeletal: Normal range of motion.  Skin:    General: Skin is warm.     Capillary Refill: Capillary refill takes less than 2 seconds.  Neurological:     General: No focal deficit present.     Mental Status: She is alert and oriented to person, place, and time.  Psychiatric:        Mood and Affect: Mood normal.        Behavior: Behavior normal.      ED Treatments / Results  Labs (all labs ordered are listed, but only abnormal results are displayed) Labs Reviewed  CBC WITH DIFFERENTIAL/PLATELET - Abnormal; Notable for the following components:      Result Value   Hemoglobin 11.8 (*)    Eosinophils Absolute 1.5 (*)    All other components within normal limits  COMPREHENSIVE METABOLIC PANEL - Abnormal; Notable for the following components:   Glucose, Bld 128 (*)    Creatinine, Ser 1.44 (*)    GFR calc non Af Amer 38 (*)    GFR calc Af Amer 44 (*)    All other components within normal limits  URINALYSIS, ROUTINE W REFLEX MICROSCOPIC - Abnormal; Notable for the following components:   Hgb urine dipstick SMALL (*)    Protein, ur 100 (*)    All other components within normal limits  CK - Abnormal; Notable for the following components:   Total CK 281 (*)    All other components within normal limits  URINALYSIS, MICROSCOPIC (REFLEX) - Abnormal; Notable for the following components:   Bacteria, UA FEW (*)    Non Squamous Epithelial PRESENT (*)    All other components within normal limits  TROPONIN I    EKG EKG Interpretation  Date/Time:  Saturday October 29 2018 09:51:06 EST Ventricular Rate:  81 PR Interval:    QRS Duration: 86 QT Interval:  384 QTC Calculation: 446 R Axis:   6 Text Interpretation:  Sinus rhythm Abnormal R-wave progression, early transition LVH by voltage No significant change since last tracing Confirmed by Richardean Canal [phone]) on 10/29/2018 9:55:32 AM   Radiology Dg Chest 2 View  Result Date:  10/29/2018 CLINICAL DATA:  Chest pain EXAM: CHEST - 2 VIEW COMPARISON:  09/07/2017 FINDINGS: The heart size and mediastinal contours are within normal limits. Both lungs are clear. The visualized skeletal structures are unremarkable. IMPRESSION: No active cardiopulmonary disease. Electronically Signed   By: Alan Ripper  Patel   On: 10/29/2018 09:56   [place] Renal  Result Date: 10/29/2018 CLINICAL DATA:  Left flank pain EXAM: RENAL / URINARY TRACT ULTRASOUND COMPLETE COMPARISON:  June 16, 2018 FINDINGS: Right Kidney: Renal measurements: 11.3 x 5.1 x 4.3 cm = volume: 129 mL . Echogenicity and renal cortical thickness are within normal limits. No perinephric fluid or hydronephrosis visualized. There is a cyst in the mid right kidney measuring 1.5 x 2.1 x 1.5 cm. A second cyst in the mid to lower pole region measures 1.4 x 1.4 x 1.5 cm. No sonographically demonstrable calculus or ureterectasis. Left Kidney: Renal measurements: 11.0 x 4.3 x 4.3 cm = volume: 106 mL. Echogenicity and renal cortical thickness are within normal limits. No mass, perinephric fluid, or hydronephrosis visualized. No sonographically demonstrable calculus or ureterectasis. Bladder: Appears normal for degree of bladder distention. IMPRESSION: Cysts in right kidney.  Study otherwise unremarkable. Electronically Signed   By: Bretta BangWilliam  Woodruff III M.D.   On: 10/29/2018 09:39    Procedures Procedures (including critical care time)  Medications Ordered in ED Medications  cyclobenzaprine (FLEXERIL) tablet 5 mg (has no administration in time range)  sodium chloride 0.9 % bolus 1,000 mL (1,000 mLs Intravenous New Bag/Given 10/29/18 0959)     Initial Impression / Assessment and Plan / ED Course  I have reviewed the triage vital signs and the nursing notes.  Pertinent labs & imaging results that were available during my care of the patient were reviewed by me and considered in my medical decision making (see chart for details).    Jackie SpareBrenda  Phillips is a 63 y.o. female here with L flank pain. Likely muscle strain from coughing vs renal colic. Will get labs, CXR, UA.   10:41 AM Cr 1.44 with GFR 40 %. Per patient that is baseline. UA unremarkable. CK slightly elevated at 280. CXR clear. US showed R renal cyst but no hydro. I think likely muscle strain from coughing. Given her baseline renal insufficiency, I only recommend low dose motrin, but she can try flexeril.    Final Clinical Impressions(s) / ED Diagnoses   Final diagnoses:  None    ED Discharge Orders    None       Charlynne PanderYao,  Hsienta, MD 10/29/18 1042

## 2018-10-29 NOTE — ED Notes (Signed)
Patient transported to Ultrasound 

## 2018-11-10 IMAGING — CT CT HEAD W/O CM
3 series · 16 of 47 positions shown, 19 images · non-contrast
Comparison: 04/19/2017

CLINICAL DATA: Dizziness for 1 week

EXAM:
CT HEAD WITHOUT CONTRAST
TECHNIQUE: Contiguous axial images were obtained from the base of the skull
through the vertex without intravenous contrast.

[Series 2: head wo · axial · 0.40mm/px · z∈[-140,-15]mm · 10 of 31 slices shown, 13 images]
[im 3/31  brain]
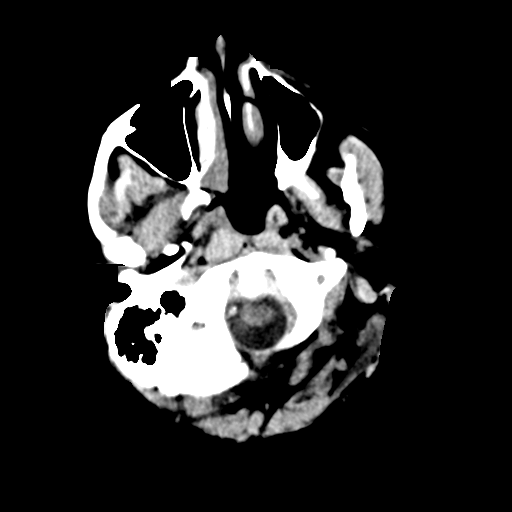
[im 3/31  bone]
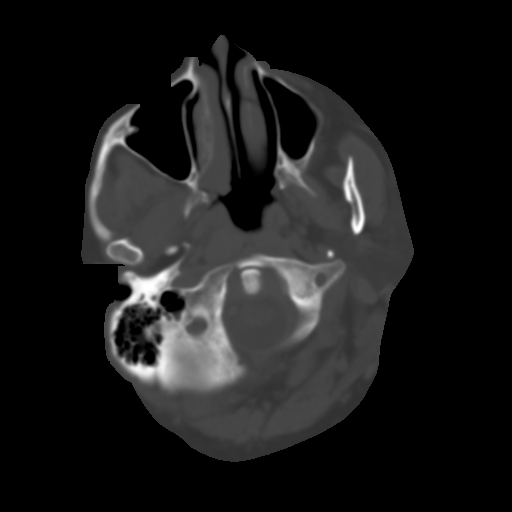
[im 6/31  brain]
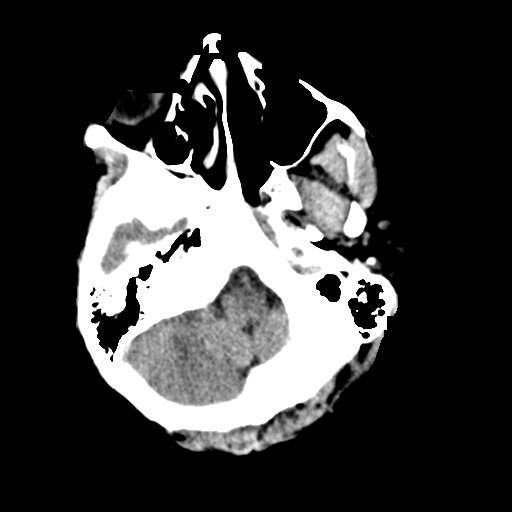
[im 9/31  brain]
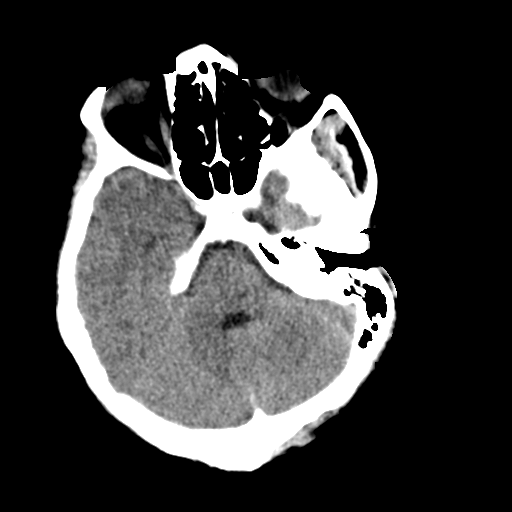
[im 11/31  brain]
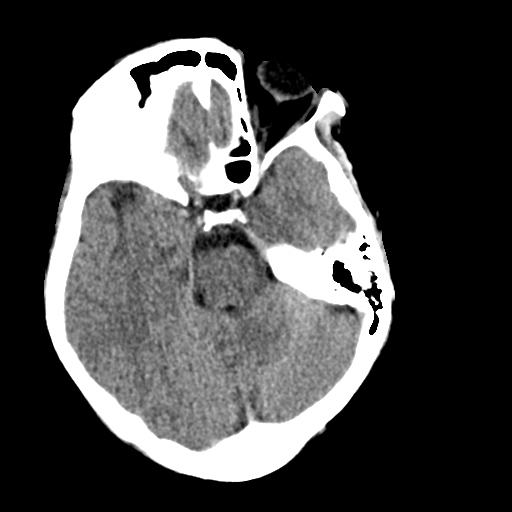
[im 14/31  brain]
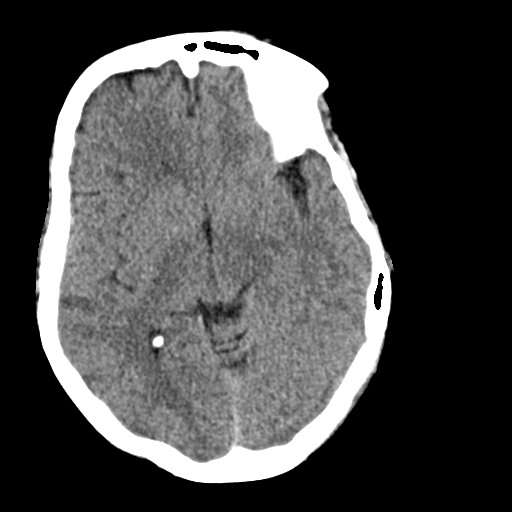
[im 14/31  bone]
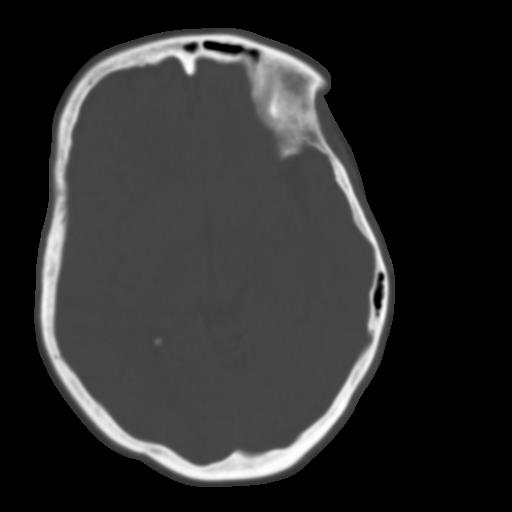
[im 17/31  brain]
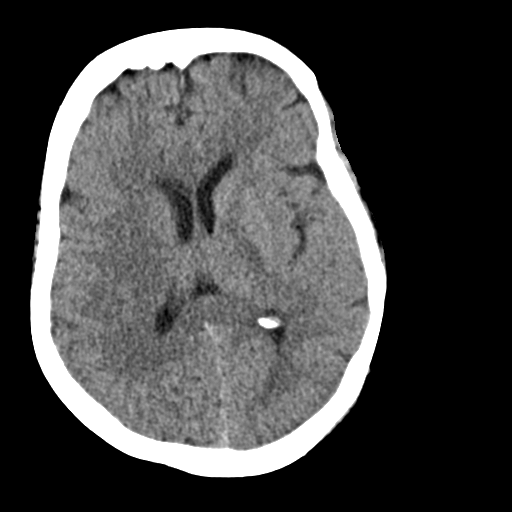
[im 20/31  brain]
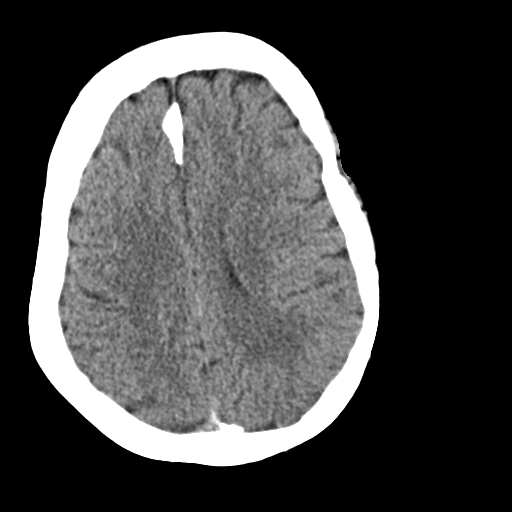
[im 23/31  brain]
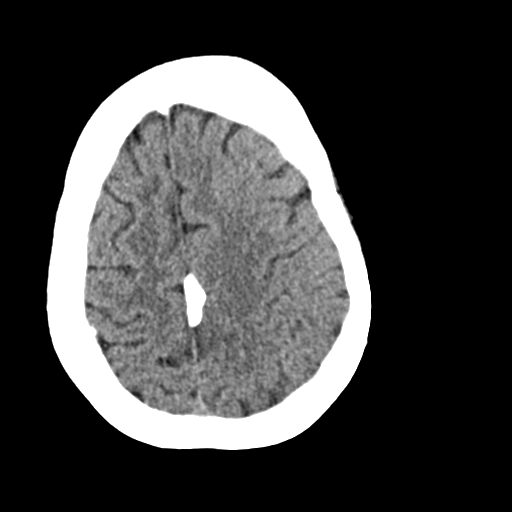
[im 25/31  brain]
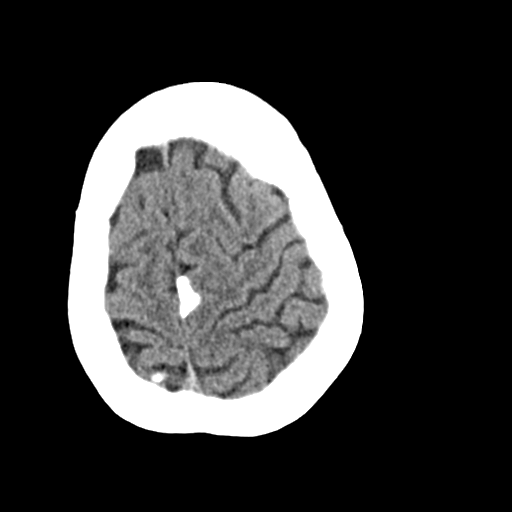
[im 25/31  bone]
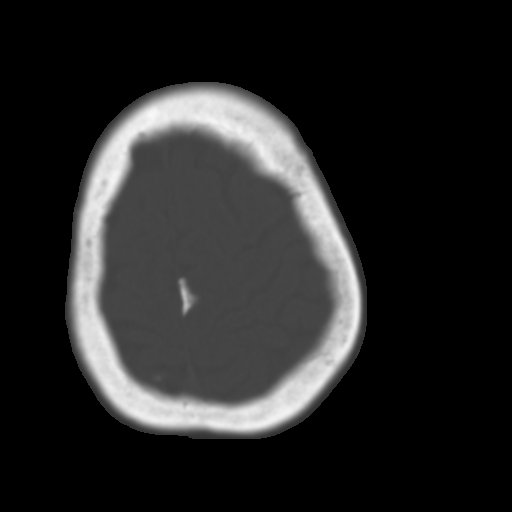
[im 28/31  brain]
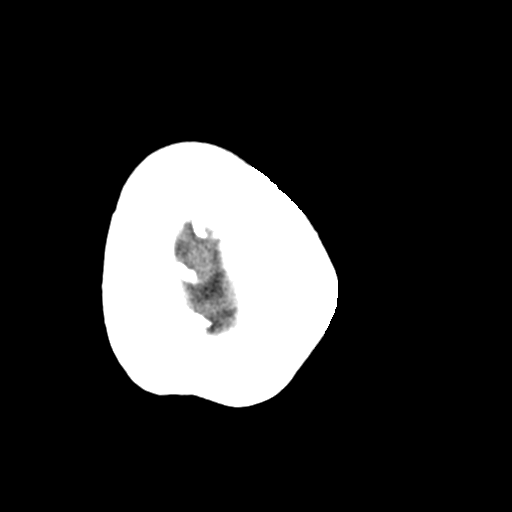

[Series 4: coronal soft · coronal · 0.32mm/px · 3 of 67 slices shown]
[im 23/67  brain]
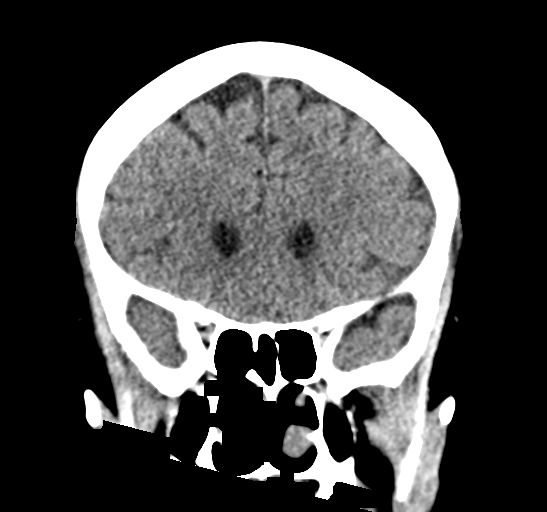
[im 30/67  brain]
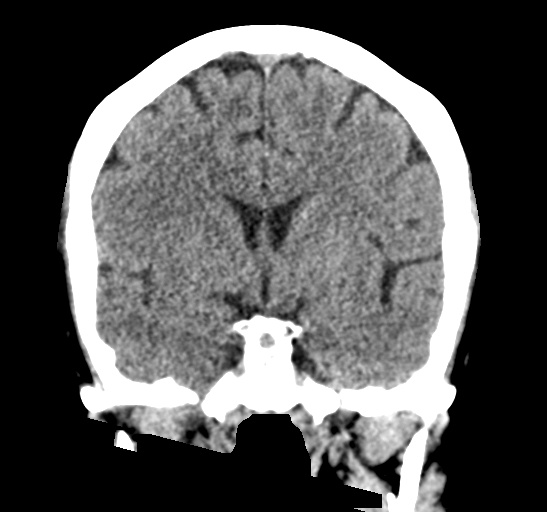
[im 37/67  brain]
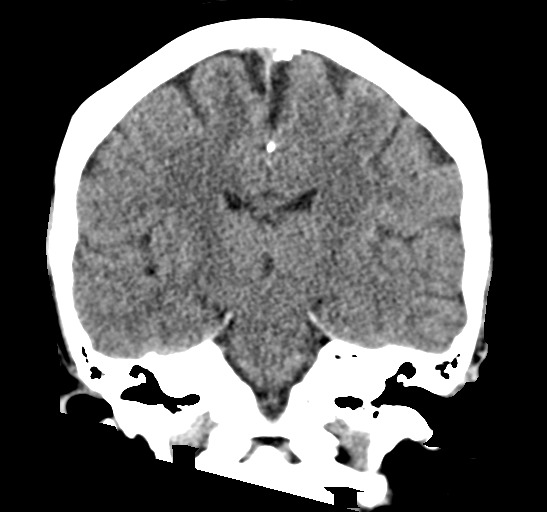

[Series 5: sag soft · sagittal · 0.32mm/px · 3 of 59 slices shown]
[im 25/59  brain]
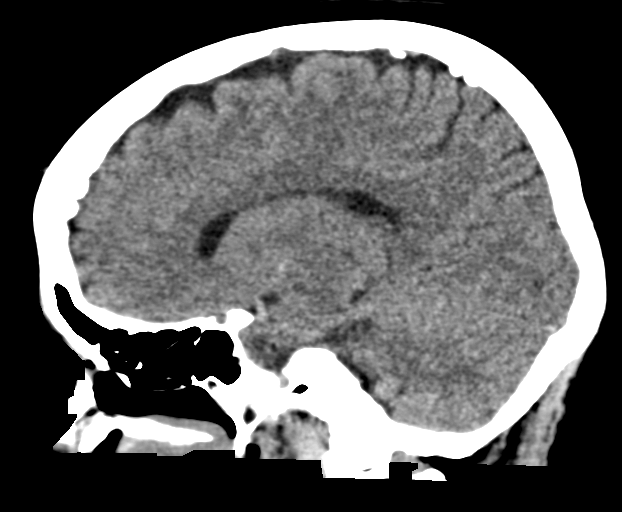
[im 30/59  brain]
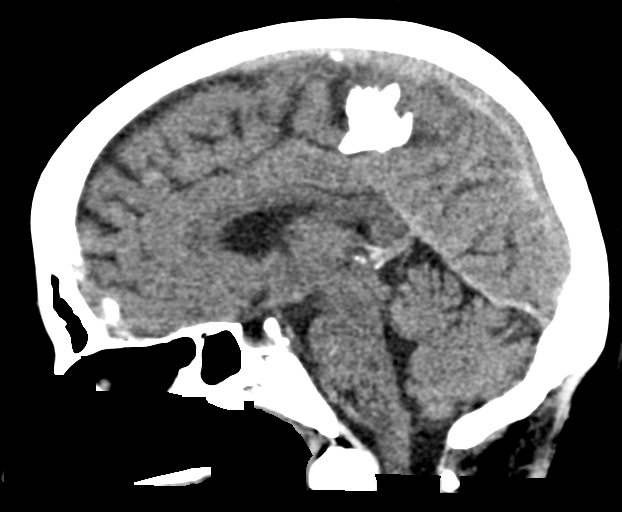
[im 34/59  brain]
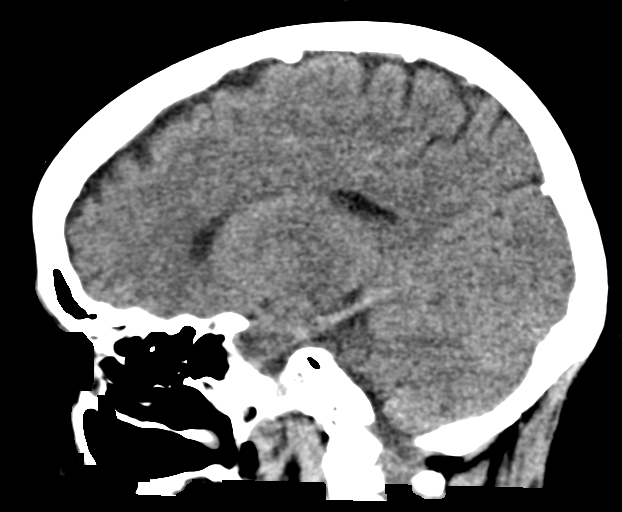

[16 of 47 positions shown; findings below may reference images not displayed]

FINDINGS: Brain: No acute territorial infarction, hemorrhage or intracranial
mass is visualized. Old lacunar infarct in the right thalamus.
Normal ventricle size. Minimal small vessel ischemic changes of the
white matter.

Vascular: No hyperdense vessels.

Skull: No fracture or suspicious bone lesion

Sinuses/Orbits: No acute finding.

Other: None
IMPRESSION: No CT evidence for acute intracranial abnormality.

## 2019-05-17 ENCOUNTER — Other Ambulatory Visit: Payer: Self-pay

## 2019-05-17 DIAGNOSIS — Z20822 Contact with and (suspected) exposure to covid-19: Secondary | ICD-10-CM

## 2019-05-18 LAB — NOVEL CORONAVIRUS, NAA: SARS-CoV-2, NAA: NOT DETECTED

## 2020-02-06 IMAGING — CR DG CHEST 2V
2 series · 2 of 2 positions shown · non-contrast
Comparison: 09/07/2017

CLINICAL DATA: Chest pain

EXAM:
CHEST - 2 VIEW

[w chest pa]
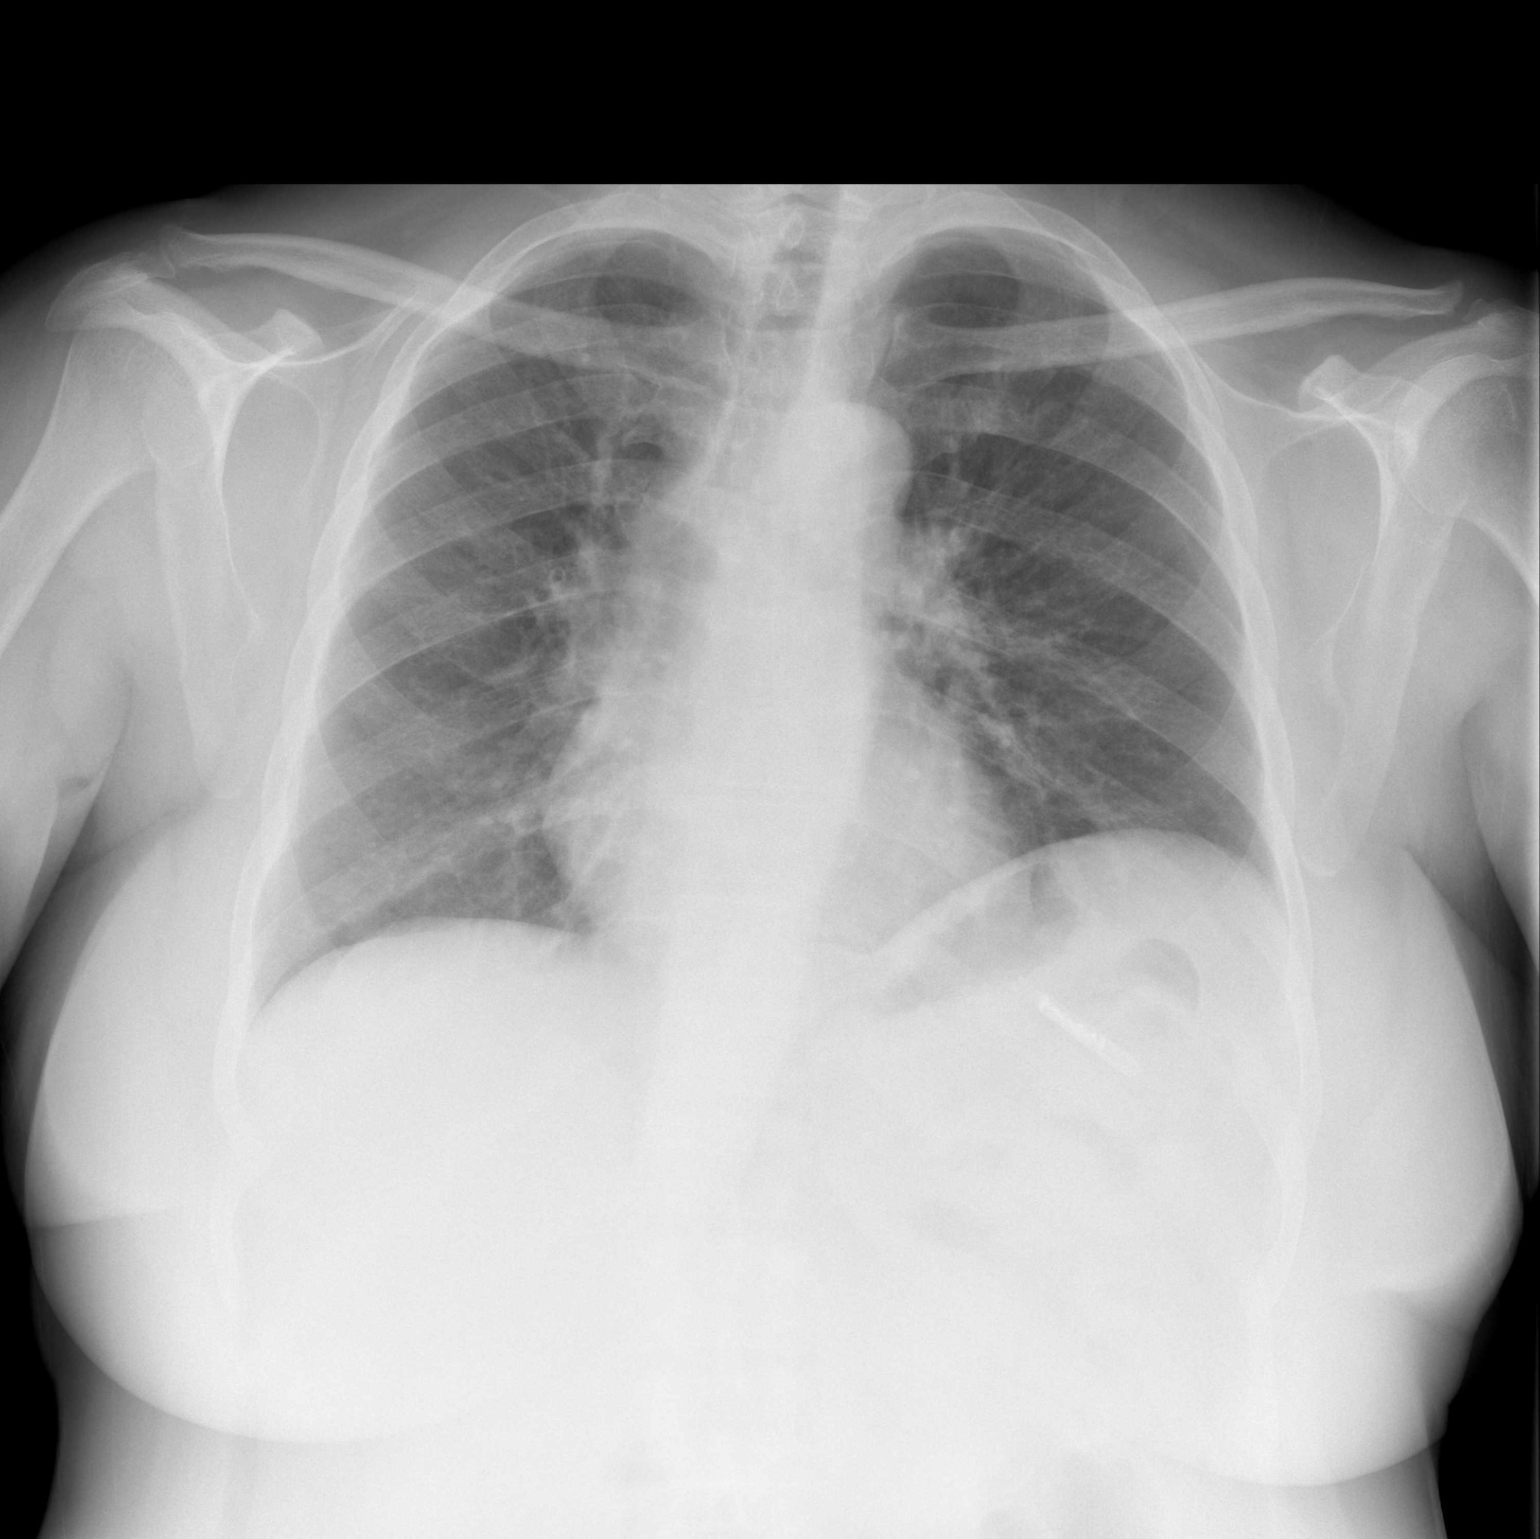

[w chest lat]
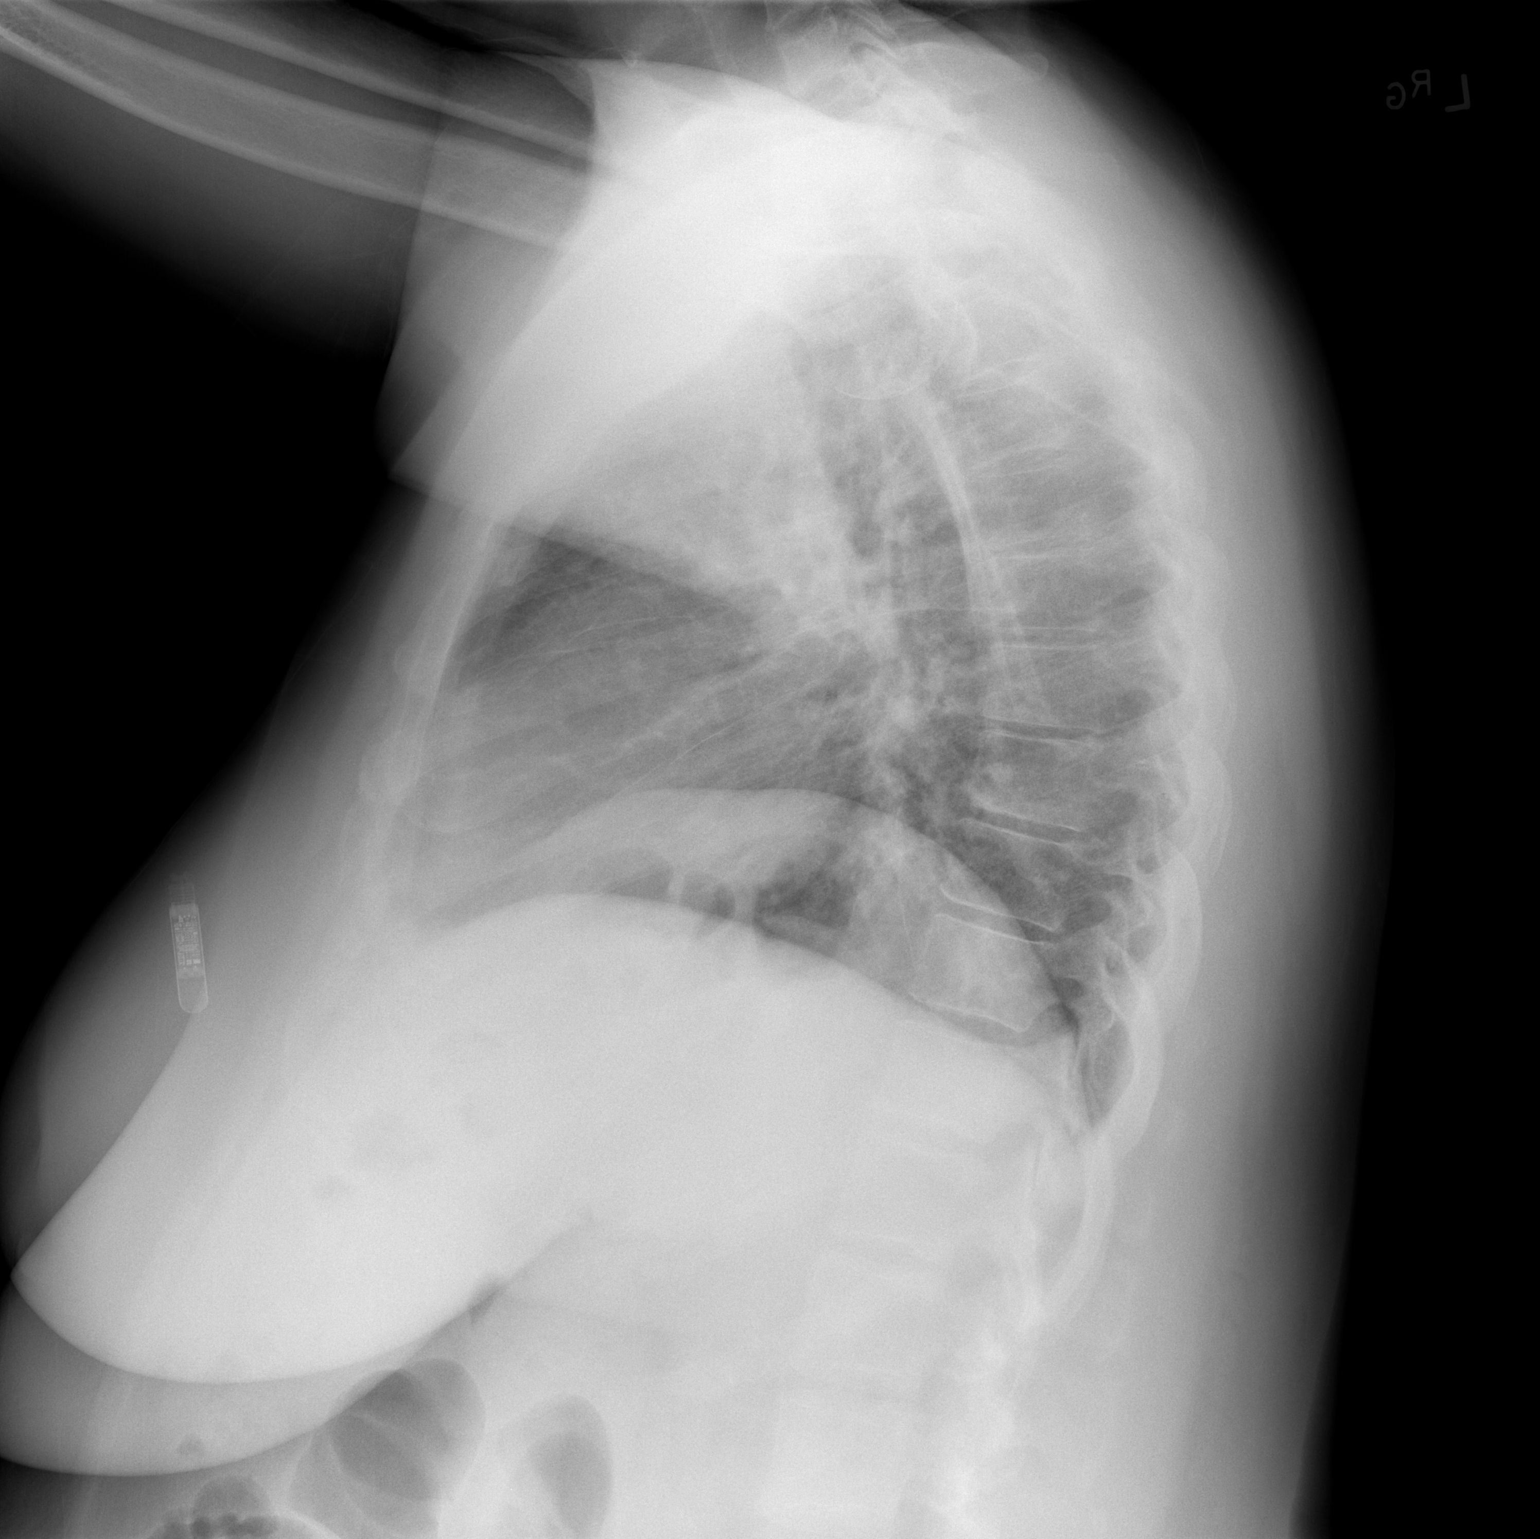

[2 of 2 positions shown; findings below may reference images not displayed]

FINDINGS: The heart size and mediastinal contours are within normal limits.
Both lungs are clear. The visualized skeletal structures are
unremarkable.
IMPRESSION: No active cardiopulmonary disease.

## 2020-08-03 IMAGING — US US RENAL
1 series · 14 of 25 positions shown · non-contrast
Comparison: June 16, 2018

CLINICAL DATA: Left flank pain

EXAM:
RENAL / URINARY TRACT ULTRASOUND COMPLETE

[Series 1: us renal · 0.27mm/px · 14 of 32 slices shown]
[im 1/32]
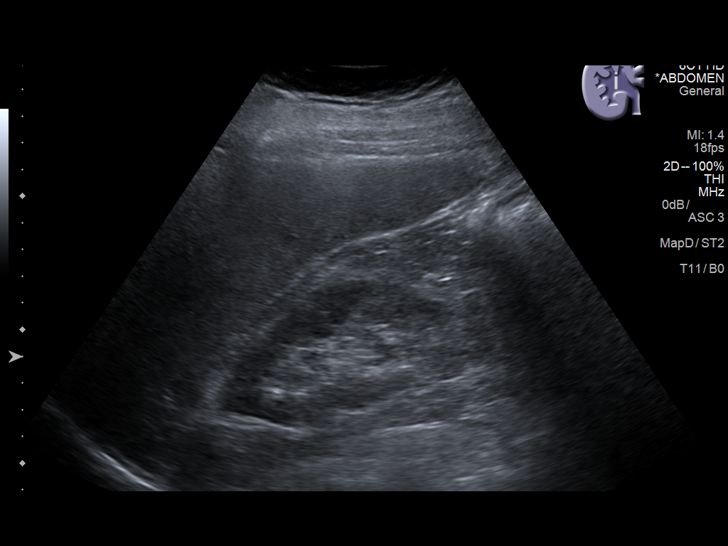
[im 3/32]
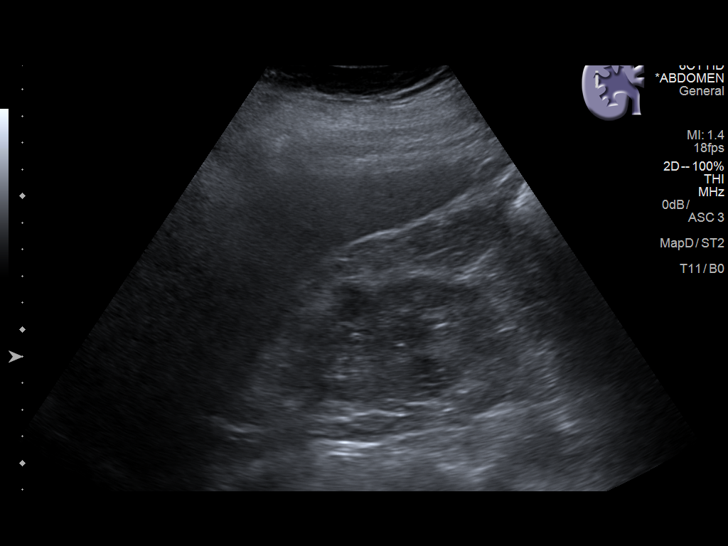
[im 6/32]
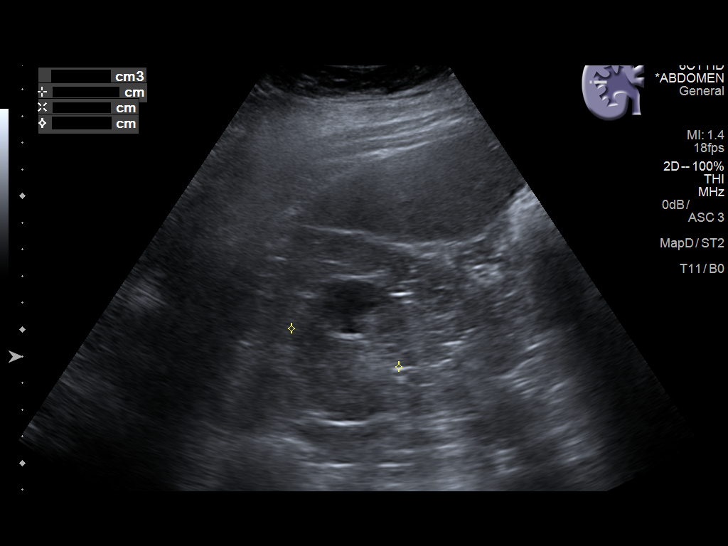
[im 8/32]
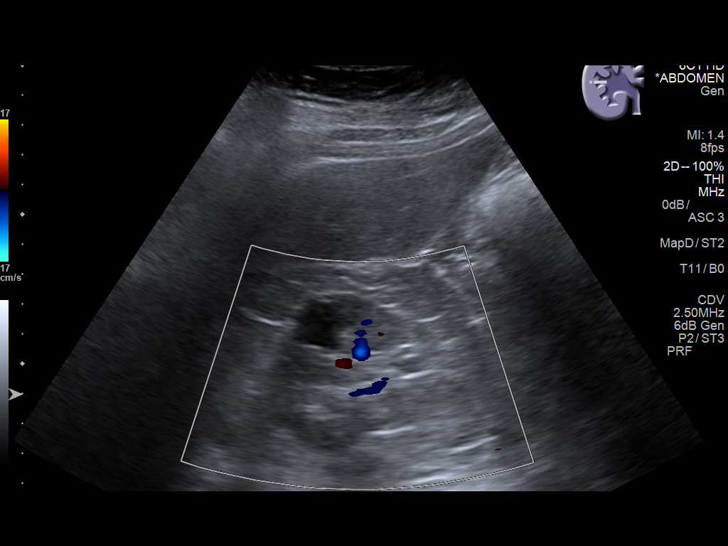
[im 11/32]
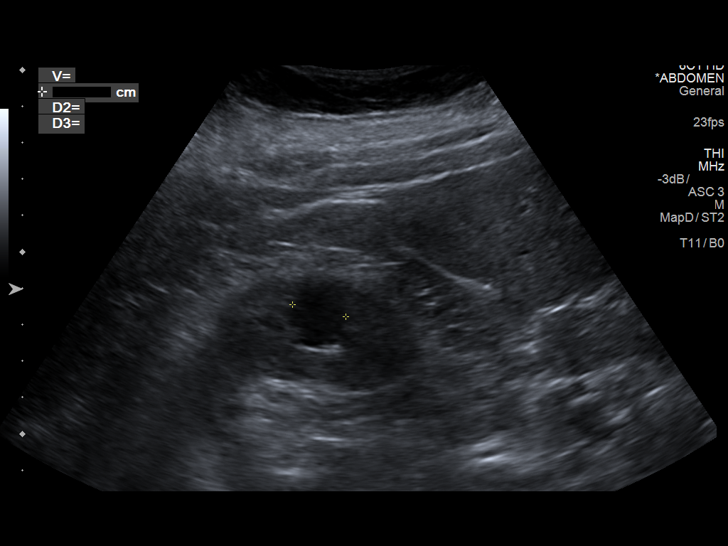
[im 12/32]
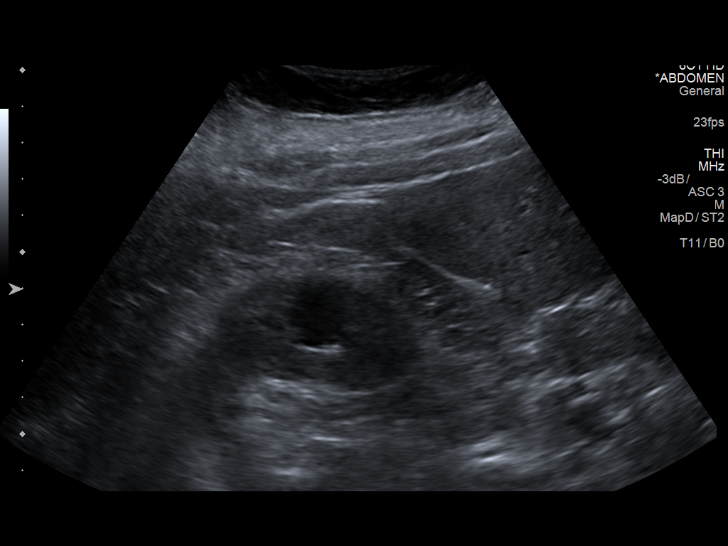
[im 15/32]
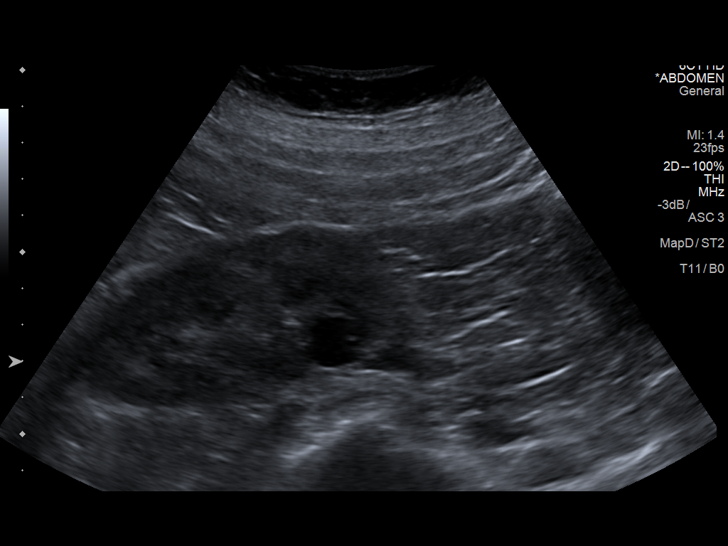
[im 17/32]
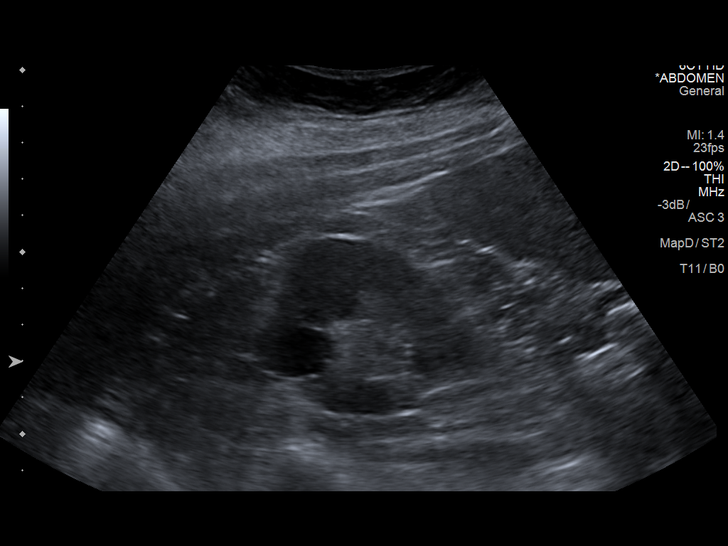
[im 20/32]
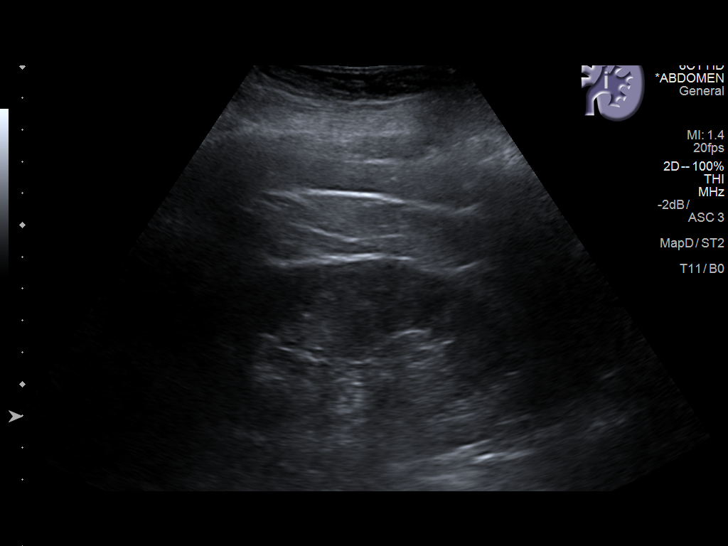
[im 21/32]
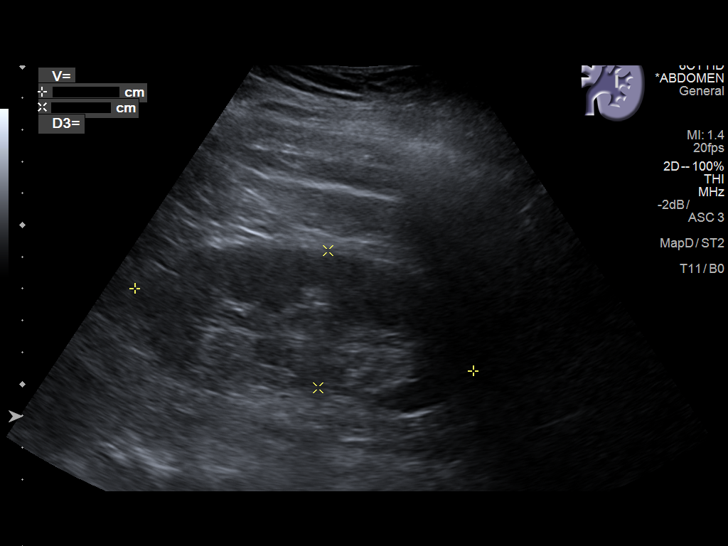
[im 24/32]
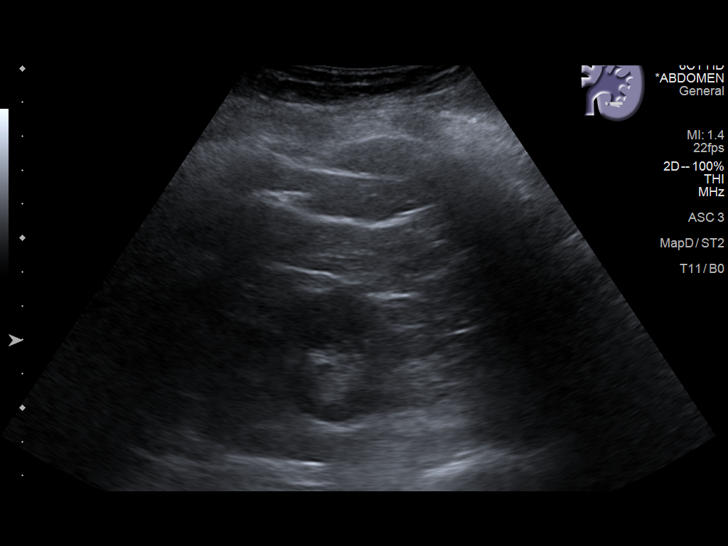
[im 26/32]
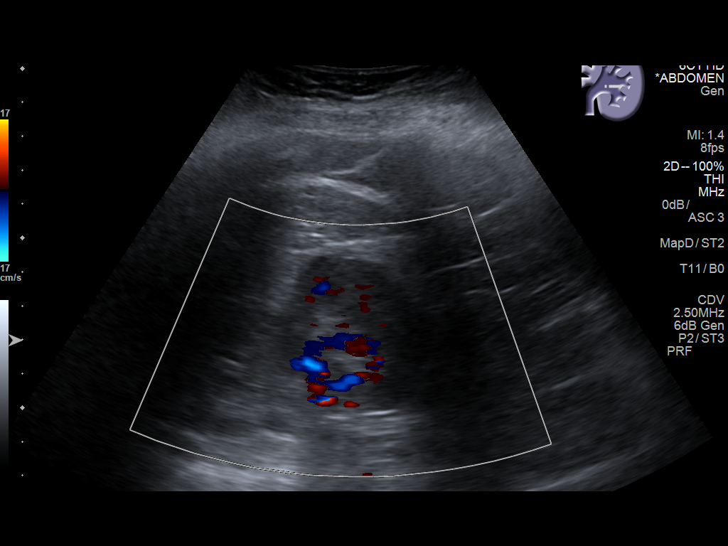
[im 29/32]
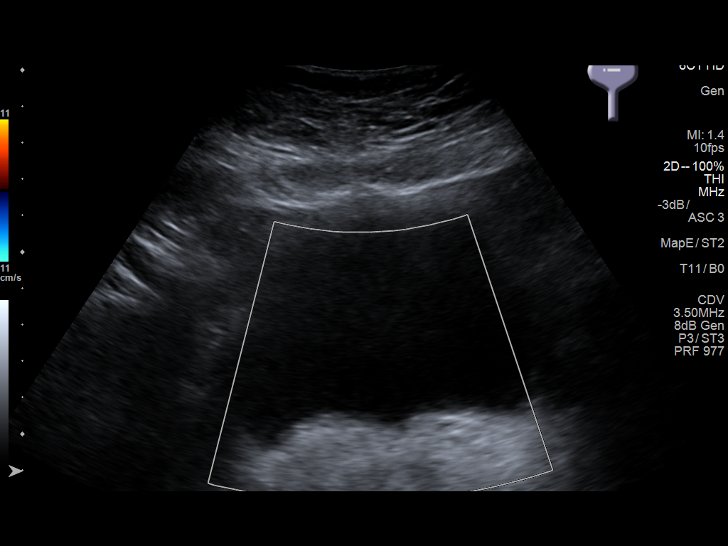
[im 32/32]
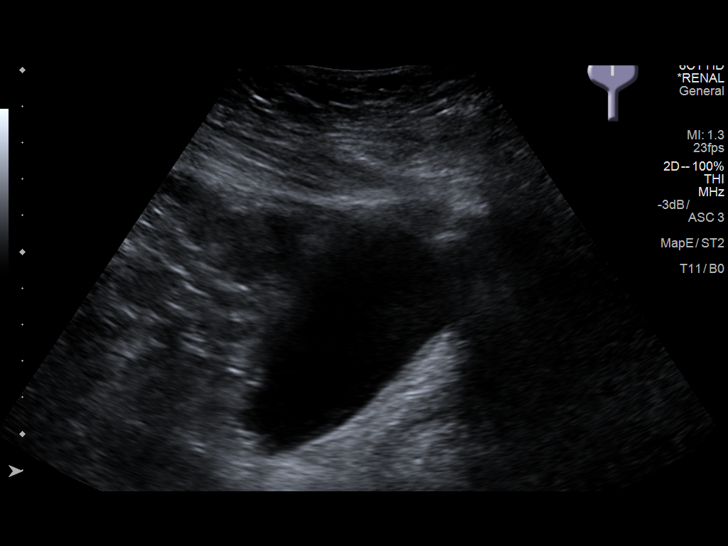

[14 of 25 positions shown; findings below may reference images not displayed]

FINDINGS: Right Kidney:

Renal measurements: 11.3 x 5.1 x 4.3 cm = volume: 129 mL .
Echogenicity and renal cortical thickness are within normal limits.
No perinephric fluid or hydronephrosis visualized. There is a cyst
in the mid right kidney measuring 1.5 x 2.1 x 1.5 cm. A second cyst
in the mid to lower pole region measures 1.4 x 1.4 x 1.5 cm. No
sonographically demonstrable calculus or ureterectasis.

Left Kidney:

Renal measurements: 11.0 x 4.3 x 4.3 cm = volume: 106 mL.
Echogenicity and renal cortical thickness are within normal limits.
No mass, perinephric fluid, or hydronephrosis visualized. No
sonographically demonstrable calculus or ureterectasis.

Bladder:

Appears normal for degree of bladder distention.
IMPRESSION: Cysts in right kidney.  Study otherwise unremarkable.

## 2021-11-03 ENCOUNTER — Other Ambulatory Visit (HOSPITAL_BASED_OUTPATIENT_CLINIC_OR_DEPARTMENT_OTHER): Payer: Self-pay

## 2021-11-03 MED ORDER — OZEMPIC (2 MG/DOSE) 8 MG/3ML ~~LOC~~ SOPN
PEN_INJECTOR | SUBCUTANEOUS | 2 refills | Status: DC
  Filled 2021-11-03: qty 9, 84d supply, fill #0
  Filled 2022-01-02 – 2022-01-05 (×2): qty 9, 84d supply, fill #1

## 2022-01-02 ENCOUNTER — Other Ambulatory Visit (HOSPITAL_BASED_OUTPATIENT_CLINIC_OR_DEPARTMENT_OTHER): Payer: Self-pay

## 2022-01-05 ENCOUNTER — Other Ambulatory Visit (HOSPITAL_BASED_OUTPATIENT_CLINIC_OR_DEPARTMENT_OTHER): Payer: Self-pay

## 2022-01-06 ENCOUNTER — Other Ambulatory Visit (HOSPITAL_BASED_OUTPATIENT_CLINIC_OR_DEPARTMENT_OTHER): Payer: Self-pay

## 2022-05-02 ENCOUNTER — Other Ambulatory Visit: Payer: Self-pay

## 2022-05-02 ENCOUNTER — Encounter (HOSPITAL_BASED_OUTPATIENT_CLINIC_OR_DEPARTMENT_OTHER): Payer: Self-pay | Admitting: Emergency Medicine

## 2022-05-02 ENCOUNTER — Emergency Department (HOSPITAL_BASED_OUTPATIENT_CLINIC_OR_DEPARTMENT_OTHER)
Admission: EM | Admit: 2022-05-02 | Discharge: 2022-05-02 | Disposition: A | Discharge status: Home / Self Care | Payer: Medicare Other | Attending: Emergency Medicine | Admitting: Emergency Medicine

## 2022-05-02 ENCOUNTER — Emergency Department (HOSPITAL_BASED_OUTPATIENT_CLINIC_OR_DEPARTMENT_OTHER): Payer: Medicare Other

## 2022-05-02 DIAGNOSIS — Z7984 Long term (current) use of oral hypoglycemic drugs: Secondary | ICD-10-CM | POA: Diagnosis not present

## 2022-05-02 DIAGNOSIS — Z794 Long term (current) use of insulin: Secondary | ICD-10-CM | POA: Insufficient documentation

## 2022-05-02 DIAGNOSIS — Z79899 Other long term (current) drug therapy: Secondary | ICD-10-CM | POA: Diagnosis not present

## 2022-05-02 DIAGNOSIS — R221 Localized swelling, mass and lump, neck: Secondary | ICD-10-CM

## 2022-05-02 DIAGNOSIS — E041 Nontoxic single thyroid nodule: Secondary | ICD-10-CM | POA: Diagnosis not present

## 2022-05-02 DIAGNOSIS — Z7902 Long term (current) use of antithrombotics/antiplatelets: Secondary | ICD-10-CM | POA: Insufficient documentation

## 2022-05-02 LAB — T4, FREE: Free T4: 0.88 ng/dL (ref 0.61–1.12)

## 2022-05-02 LAB — TSH: TSH: 0.846 u[IU]/mL (ref 0.350–4.500)

## 2022-05-02 NOTE — ED Provider Notes (Signed)
[facility] EMERGENCY DEPARTMENT Provider Note   CSN: [id number] Arrival date & time: 05/02/22  1032     History  Chief Complaint  Patient presents with   neck swelling    Jackie Phillips is a 66 y.o. female.   66 year old female presenting to the emergency department with concern for swelling in her neck.  The patient states that she noticed swelling in the right side of her neck last Friday.  She has a history of thyroid nodules.  She is about to start [facility] and wanted her thyroid function checked.  She denies any chest pain, heart palpitations, shortness of breath.  She is tolerating oral intake and otherwise well-appearing with no other complaints.  She denies any fevers or chills.  Home Medications Prior to Admission medications   Medication Sig Start Date End Date Taking? Authorizing Provider  amLODipine (NORVASC) 5 MG tablet Take 5 mg by mouth at bedtime.      [provider]  atorvastatin (LIPITOR) 20 MG tablet Take 40 mg by mouth daily.     [provider]  benazepril (LOTENSIN) 20 MG tablet TAKE 1 TABLET BY MOUTH EVERY DAY 09/28/15   [provider]  cholecalciferol (VITAMIN D) 1000 UNITS tablet Take 1,000 Units by mouth daily.      [provider]  Clopidogrel Bisulfate (PLAVIX PO) Take by mouth.    [provider]  cyclobenzaprine (FLEXERIL) 5 MG tablet Take 1 tablet (5 mg total) by mouth 3 (three) times daily as needed for muscle spasms. 10/29/18   Charlynne Pander, MD  Dapagliflozin Propanediol (FARXIGA PO) Take by mouth.    [provider]  esomeprazole (NEXIUM) 40 MG capsule Take 40 mg by mouth daily before breakfast.      [provider]  insulin aspart (NOVOLOG) 100 UNIT/ML injection Inject 20 Units into the skin 3 (three) times daily before meals.      [provider]  metFORMIN (GLUCOPHAGE) 1000 MG tablet Take 1,000 mg by mouth 2 (two) times daily with a meal.      [provider]  potassium chloride (KLOR-CON) 10 MEQ CR tablet Take 10 mEq by mouth daily.      [provider]  Semaglutide (OZEMPIC [place]) Inject into the skin.    [provider]  valACYclovir (VALTREX) 500 MG tablet Take 500 mg by mouth daily.      [provider]      Allergies    Codeine and Fish allergy    Review of Systems   Review of Systems  Musculoskeletal:  Negative for neck pain.  All other systems reviewed and are negative.   Physical Exam Updated Vital Signs BP 127/75   Pulse 81   Temp 97.9 F (36.6 C) (Oral)   Resp 16   SpO2 96%  Physical Exam Vitals and nursing note reviewed.  Constitutional:      General: She is not in acute distress.    Appearance: She is well-developed.  HENT:     Head: Normocephalic and atraumatic.  Eyes:     Conjunctiva/sclera: Conjunctivae normal.  Neck:     Thyroid: Thyromegaly present. No thyroid tenderness.     Comments: Nontender lymphadenopathy versus thyroid nodule palpated.  Intact range of motion of the neck.  Mild right-sided thyromegaly noted. Cardiovascular:     Rate and Rhythm: Normal rate and regular rhythm.  Pulmonary:     Effort: Pulmonary effort is normal. No respiratory distress.  Breath sounds: Normal breath sounds.  Abdominal:     Palpations: Abdomen is soft.     Tenderness: There is no abdominal tenderness.  Musculoskeletal:        General: No swelling.     Cervical back: Full passive range of motion without pain and neck supple.  Lymphadenopathy:     Cervical: Cervical adenopathy present.  Skin:    General: Skin is warm and dry.     Capillary Refill: Capillary refill takes less than 2 seconds.  Neurological:     Mental Status: She is alert.  Psychiatric:        Mood and Affect: Mood normal.     ED Results / Procedures / Treatments   Labs (all labs ordered are listed, but only abnormal results are displayed) Labs Reviewed  TSH  T4, FREE    EKG None  Radiology US  THYROID  Result Date: 05/02/2022 CLINICAL DATA:  Follow-up thyroid nodules. Increased swelling. Left thyroid nodule was biopsied on 07/19/2017. EXAM: THYROID ULTRASOUND TECHNIQUE: Ultrasound examination of the thyroid gland and adjacent soft tissues was performed. COMPARISON:  09/11/2019 and ultrasound 06/10/2017 FINDINGS: Parenchymal Echotexture: Mildly heterogenous Isthmus: 0.3 cm Right lobe: 5.4 x 2.6 x 2.7 cm Left lobe: 5.6 x 2.1 x 2.3 cm _________________________________________________________ Estimated total number of nodules >/= 1 cm: 2 Number of spongiform nodules >/=  2 cm not described below (TR1): 0 Number of mixed cystic and solid nodules >/= 1.5 cm not described below (TR2): 0 _________________________________________________________ Nodule # 1: Prior biopsy: No Location: Right; Mid Maximum size: 3.1 cm; Other 2 dimensions: 2.2 x 2.9 cm, previously, 2.4 x 1.7 x 1.9 cm cm Composition: solid/almost completely solid (2); composition is uncertain and could be spongiform Echogenicity: isoechoic (1) Shape: not taller-than-wide (0) Margins: ill-defined (0) Echogenic foci: none (0) ACR TI-RADS total points: 3. ACR TI-RADS risk category:  TR3 (3 points). Significant change in size (>/= 20% in two dimensions and minimal increase of 2 mm): Yes Change in features: Yes; composition appears to be more solid but indeterminate Change in ACR TI-RADS risk category: Yes ACR TI-RADS recommendations: **Given size (>/= 2.5 cm) and appearance, fine needle aspiration of this mildly suspicious nodule should be considered based on TI-RADS criteria. _________________________________________________________ Again noted is a heterogeneous bilobed nodular structure involving the inferior left thyroid lobe. This could represent 2 adjacent nodules. The morphology of this bilobed nodular structure is similar to the previous examination. Maximum size of this nodular conglomeration is 3.2 x 2.2 x 2.1 cm and previously measured 3.2 x  1.8 x 2.1 cm. The nodular component in the mid left thyroid lobe roughly measures up to 1.2 cm and similar to the previous examination. No new thyroid nodules. IMPRESSION: 1. Bilateral thyroid nodules. 2. Previously biopsied left thyroid nodule has not significantly changed since 2020. 3. Nodule in the right mid thyroid lobe has enlarged since 2020, now measuring up to 3.1 cm. 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 The above is in keeping with the ACR TI-RADS recommendations - J Am Coll Radiol 2017;14:587-595. Electronically Signed   By: Richarda Overlie M.D.   On: 05/02/2022 15:21    Procedures Procedures    Medications Ordered in ED Medications - No data to display  ED Course/ Medical Decision Making/ A&P                           Medical Decision Making  Amount and/or Complexity of Data Reviewed Labs: ordered. Radiology: ordered.    66 year old female presenting to the emergency department with concern for swelling in her neck.  The patient states that she noticed swelling in the right side of her neck last Friday.  She has a history of thyroid nodules.  She is about to start [facility] and wanted her thyroid function checked.  She denies any chest pain, heart palpitations, shortness of breath.  She is tolerating oral intake and otherwise well-appearing with no other complaints.  She denies any fevers or chills.  On exam, the patient had mild asymmetric swelling of the right side of her neck about the thyroid.  Differential includes thyroid cyst, new thyroid tumor, cervical lymphadenopathy.  Lower concern for deep space neck infection.  Intact range of motion of the neck without pain.  Patient has nontender adenopathy.  No fevers or chills, no weight loss.  No trauma to the neck.  Low concern for deep space infection with no infectious symptoms.  Patient protecting her airway, low concern for PTA, RPA, Ludwig's angina.  Ultrasound of the thyroid  was performed to further evaluate.  TSH and free T4 were collected and pending.  Patient has outpatient follow-up with her endocrinologist.  Advised the patient that studies will be available on the patient's portal.  IMPRESSION:  1. Bilateral thyroid nodules.  2. Previously biopsied left thyroid nodule has not significantly  changed since 2020.  3. Nodule in the right mid thyroid lobe has enlarged since 2020, now  measuring up to 3.1 cm. 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    The above is in keeping with the ACR TI-RADS recommendations - J Am  Coll Radiol 2017;14:587-595.   Advised patient of finding of right sided thyroid nodule measuring 3.1 cm which meets criteria for ultrasound-guided biopsy.  Advised the patient to call her endocrinologist to follow-up.  Advised the patient to hold for [facility] if uncomfortable initiating the medication prior to discussion with her endocrinologist. Overall stable for discharge.  Final Clinical Impression(s) / ED Diagnoses Final diagnoses:  Neck swelling  Thyroid nodule    Rx / DC Orders ED Discharge Orders     None         Ernie Avena, MD 05/02/22 1531

## 2022-05-02 NOTE — Discharge Instructions (Addendum)
Your thyroid has a nodule that measures greater than 3 cm on the right and warrants ultrasound-guided biopsy outpatient given the size. Please follow-up with your PCP and/or endocrinologist to coordinate biopsy and further care.  IMPRESSION:  1. Bilateral thyroid nodules.  2. Previously biopsied left thyroid nodule has not significantly  changed since 2020.  3. Nodule in the right mid thyroid lobe has enlarged since 2020, now  measuring up to 3.1 cm. 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    The above is in keeping with the ACR TI-RADS recommendations - J Am  Coll Radiol 2017;14:587-595.

## 2022-05-02 NOTE — ED Triage Notes (Signed)
Patient states that she has a little swelling in her throat . States that she felt it last Friday. States that she has a history of three nodules on her thyroid. Patient is getting ready to start a new medication and wants to make sure that thyroid levels are ok before starting medication

## 2022-06-17 ENCOUNTER — Other Ambulatory Visit: Payer: Self-pay

## 2022-06-17 ENCOUNTER — Encounter (HOSPITAL_BASED_OUTPATIENT_CLINIC_OR_DEPARTMENT_OTHER): Payer: Self-pay

## 2022-06-17 ENCOUNTER — Emergency Department (HOSPITAL_BASED_OUTPATIENT_CLINIC_OR_DEPARTMENT_OTHER)
Admission: EM | Admit: 2022-06-17 | Discharge: 2022-06-17 | Disposition: A | Discharge status: Home / Self Care | Payer: Medicare Other | Attending: Emergency Medicine | Admitting: Emergency Medicine

## 2022-06-17 ENCOUNTER — Emergency Department (HOSPITAL_BASED_OUTPATIENT_CLINIC_OR_DEPARTMENT_OTHER): Payer: Medicare Other

## 2022-06-17 DIAGNOSIS — Z7984 Long term (current) use of oral hypoglycemic drugs: Secondary | ICD-10-CM | POA: Insufficient documentation

## 2022-06-17 DIAGNOSIS — N1832 Chronic kidney disease, stage 3b: Secondary | ICD-10-CM | POA: Insufficient documentation

## 2022-06-17 DIAGNOSIS — Z79899 Other long term (current) drug therapy: Secondary | ICD-10-CM | POA: Insufficient documentation

## 2022-06-17 DIAGNOSIS — E119 Type 2 diabetes mellitus without complications: Secondary | ICD-10-CM | POA: Insufficient documentation

## 2022-06-17 DIAGNOSIS — Z794 Long term (current) use of insulin: Secondary | ICD-10-CM | POA: Insufficient documentation

## 2022-06-17 DIAGNOSIS — R059 Cough, unspecified: Secondary | ICD-10-CM | POA: Diagnosis present

## 2022-06-17 DIAGNOSIS — U071 COVID-19: Secondary | ICD-10-CM | POA: Insufficient documentation

## 2022-06-17 DIAGNOSIS — I129 Hypertensive chronic kidney disease with stage 1 through stage 4 chronic kidney disease, or unspecified chronic kidney disease: Secondary | ICD-10-CM | POA: Insufficient documentation

## 2022-06-17 LAB — CBC WITH DIFFERENTIAL/PLATELET
Abs Immature Granulocytes: 0.02 10*3/uL (ref 0.00–0.07)
Basophils Absolute: 0 10*3/uL (ref 0.0–0.1)
Basophils Relative: 1 %
Eosinophils Absolute: 0.1 10*3/uL (ref 0.0–0.5)
Eosinophils Relative: 2 %
HCT: 34.5 % — ABNORMAL LOW (ref 36.0–46.0)
Hemoglobin: 10.9 g/dL — ABNORMAL LOW (ref 12.0–15.0)
Immature Granulocytes: 0 %
Lymphocytes Relative: 16 %
Lymphs Abs: 0.8 10*3/uL (ref 0.7–4.0)
MCH: 28 pg (ref 26.0–34.0)
MCHC: 31.6 g/dL (ref 30.0–36.0)
MCV: 88.7 fL (ref 80.0–100.0)
Monocytes Absolute: 1.2 10*3/uL — ABNORMAL HIGH (ref 0.1–1.0)
Monocytes Relative: 23 %
Neutro Abs: 3 10*3/uL (ref 1.7–7.7)
Neutrophils Relative %: 58 %
Platelets: 172 10*3/uL (ref 150–400)
RBC: 3.89 MIL/uL (ref 3.87–5.11)
RDW: 14.1 % (ref 11.5–15.5)
WBC: 5.2 10*3/uL (ref 4.0–10.5)
nRBC: 0 % (ref 0.0–0.2)

## 2022-06-17 LAB — BASIC METABOLIC PANEL
Anion gap: 8 (ref 5–15)
BUN: 18 mg/dL (ref 8–23)
CO2: 24 mmol/L (ref 22–32)
Calcium: 8.7 mg/dL — ABNORMAL LOW (ref 8.9–10.3)
Chloride: 106 mmol/L (ref 98–111)
Creatinine, Ser: 1.72 mg/dL — ABNORMAL HIGH (ref 0.44–1.00)
GFR, Estimated: 32 mL/min — ABNORMAL LOW (ref >60)
Glucose, Bld: 122 mg/dL — ABNORMAL HIGH (ref 70–99)
Potassium: 3.5 mmol/L (ref 3.5–5.1)
Sodium: 138 mmol/L (ref 135–145)

## 2022-06-17 LAB — SARS CORONAVIRUS 2 BY RT PCR: SARS Coronavirus 2 by RT PCR: POSITIVE — AB

## 2022-06-17 MED ORDER — PREDNISONE 50 MG PO TABS: 50.0000 mg | ORAL_TABLET | Freq: Every day | ORAL | 0 refills | Status: AC

## 2022-06-17 MED ORDER — HYDROCOD POLI-CHLORPHE POLI ER 10-8 MG/5ML PO SUER: 5.0000 mL | Freq: Two times a day (BID) | ORAL | 0 refills | Status: AC | PRN

## 2022-06-17 MED ORDER — NIRMATRELVIR/RITONAVIR (PAXLOVID) TABLET (RENAL DOSING): 2.0000 | ORAL_TABLET | Freq: Two times a day (BID) | ORAL | 0 refills | Status: AC

## 2022-06-17 MED ORDER — ALBUTEROL SULFATE HFA 108 (90 BASE) MCG/ACT IN AERS
2.0000 | INHALATION_SPRAY | Freq: Once | RESPIRATORY_TRACT | Status: AC
  Administered 2022-06-17: 2 via RESPIRATORY_TRACT
  Filled 2022-06-17: qty 6.7

## 2022-06-17 MED ORDER — HYDROCOD POLI-CHLORPHE POLI ER 10-8 MG/5ML PO SUER
5.0000 mL | Freq: Once | ORAL | Status: AC
  Administered 2022-06-17: 5 mL via ORAL
  Filled 2022-06-17: qty 5

## 2022-06-17 MED ORDER — AEROCHAMBER Z-STAT PLUS/MEDIUM MISC
1.0000 | Freq: Once | Status: AC
Start: 2022-06-17 — End: 2022-06-17
  Administered 2022-06-17: 1
  Filled 2022-06-17: qty 1

## 2022-06-17 NOTE — ED Triage Notes (Signed)
Cough since last night. Took home covid test, not sure if it was accurate but tested positive. Requesting test.

## 2022-06-17 NOTE — ED Provider Notes (Signed)
[facility] EMERGENCY DEPARTMENT Provider Note   CSN: [id number] Arrival date & time: 06/17/22  0947     History  Chief Complaint  Patient presents with   Cough    Jackie Phillips is a 66 y.o. female.  Pt is a 66 yo female with a pmhx significant for htn, hld, dm2, cva, gerd, and seasonal allergies.  Pt said she has had a little cough for about a week, but nothing bad.  Yesterday, she developed a bad cough and coughed all night long.  She took a home covid test today and it was positive.  She has not had a known case of covid.  She's been fully vaccinated + boosters.  Husband is not sick.       Home Medications Prior to Admission medications   Medication Sig Start Date End Date Taking? Authorizing Provider  chlorpheniramine-HYDROcodone (TUSSIONEX) 10-8 MG/5ML Take 5 mLs by mouth every 12 (twelve) hours as needed for cough. 06/17/22  Yes Jacalyn Lefevre, MD  nirmatrelvir/ritonavir EUA, renal dosing, (PAXLOVID) 10 x 150 MG & 10 x 100MG  TABS Take 2 tablets by mouth 2 (two) times daily for 5 days. Patient GFR is 32. Take nirmatrelvir (150 mg) one tablet twice daily for 5 days and ritonavir (100 mg) one tablet twice daily for 5 days. 06/17/22 06/22/22 Yes 08/22/22, MD  predniSONE (DELTASONE) 50 MG tablet Take 1 tablet (50 mg total) by mouth daily with breakfast. 06/17/22  Yes 08/17/22, MD  amLODipine (NORVASC) 5 MG tablet Take 5 mg by mouth at bedtime.      [provider]  atorvastatin (LIPITOR) 20 MG tablet Take 40 mg by mouth daily.     [provider]  benazepril (LOTENSIN) 20 MG tablet TAKE 1 TABLET BY MOUTH EVERY DAY 09/28/15   [provider]  cholecalciferol (VITAMIN D) 1000 UNITS tablet Take 1,000 Units by mouth daily.      [provider]  Clopidogrel Bisulfate (PLAVIX PO) Take by mouth.    [provider]  cyclobenzaprine (FLEXERIL) 5 MG tablet Take 1 tablet (5 mg total) by mouth 3 (three) times daily as needed for  muscle spasms. 10/29/18   10/31/18, MD  Dapagliflozin Propanediol (FARXIGA PO) Take by mouth.    [provider]  esomeprazole (NEXIUM) 40 MG capsule Take 40 mg by mouth daily before breakfast.      [provider]  insulin aspart (NOVOLOG) 100 UNIT/ML injection Inject 20 Units into the skin 3 (three) times daily before meals.      [provider]  metFORMIN (GLUCOPHAGE) 1000 MG tablet Take 1,000 mg by mouth 2 (two) times daily with a meal.      [provider]  potassium chloride (KLOR-CON) 10 MEQ CR tablet Take 10 mEq by mouth daily.      [provider]  Semaglutide (OZEMPIC [place]) Inject into the skin.    [provider]  valACYclovir (VALTREX) 500 MG tablet Take 500 mg by mouth daily.      [provider]      Allergies    Codeine and Fish allergy    Review of Systems   Review of Systems  Respiratory:  Positive for cough.   All other systems reviewed and are negative.   Physical Exam Updated Vital Signs BP (!) 140/66 (BP Location: Right Arm)   Pulse 96   Temp 99.1 F (37.3 C) (Oral)   Resp 17   Ht 5\' 5"  (1.651 m)  Wt 81.6 kg   SpO2 95%   BMI 29.95 kg/m  Physical Exam Vitals and nursing note reviewed.  Constitutional:      Appearance: Normal appearance.  HENT:     Head: Normocephalic and atraumatic.     Right Ear: External ear normal.     Left Ear: External ear normal.     Nose: Nose normal.     Mouth/Throat:     Mouth: Mucous membranes are moist.     Pharynx: Oropharynx is clear.  Eyes:     Extraocular Movements: Extraocular movements intact.     Pupils: Pupils are equal, round, and reactive to light.  Cardiovascular:     Rate and Rhythm: Normal rate and regular rhythm.     Pulses: Normal pulses.     Heart sounds: Normal heart sounds.  Pulmonary:     Effort: Pulmonary effort is normal.     Breath sounds: Normal breath sounds.  Abdominal:     General: Abdomen is flat. Bowel sounds are  normal.     Palpations: Abdomen is soft.  Musculoskeletal:        General: Normal range of motion.     Cervical back: Normal range of motion and neck supple.  Skin:    General: Skin is warm.     Capillary Refill: Capillary refill takes less than 2 seconds.  Neurological:     General: No focal deficit present.     Mental Status: She is alert and oriented to person, place, and time.  Psychiatric:        Mood and Affect: Mood normal.        Behavior: Behavior normal.     ED Results / Procedures / Treatments   Labs (all labs ordered are listed, but only abnormal results are displayed) Labs Reviewed  SARS CORONAVIRUS 2 BY RT PCR - Abnormal; Notable for the following components:      Result Value   SARS Coronavirus 2 by RT PCR POSITIVE (*)    All other components within normal limits  BASIC METABOLIC PANEL - Abnormal; Notable for the following components:   Glucose, Bld 122 (*)    Creatinine, Ser 1.72 (*)    Calcium 8.7 (*)    GFR, Estimated 32 (*)    All other components within normal limits  CBC WITH DIFFERENTIAL/PLATELET - Abnormal; Notable for the following components:   Hemoglobin 10.9 (*)    HCT 34.5 (*)    Monocytes Absolute 1.2 (*)    All other components within normal limits    EKG None  Radiology DG Chest Portable 1 View  Result Date: 06/17/2022 CLINICAL DATA:  Cough since last night.  COVID positive. EXAM: PORTABLE CHEST 1 VIEW COMPARISON:  10/29/2018 FINDINGS: Chronic elevation of the left diaphragm. No focal consolidation. No pleural effusion or pneumothorax. Heart and mediastinal contours are unremarkable. No acute osseous abnormality. IMPRESSION: No active disease. Electronically Signed   By: Elige Ko M.D.   On: 06/17/2022 10:59    Procedures Procedures    Medications Ordered in ED Medications  albuterol (VENTOLIN HFA) 108 (90 Base) MCG/ACT inhaler 2 puff (has no administration in time range)  aerochamber Z-Stat Plus/medium 1 each (has no  administration in time range)  chlorpheniramine-HYDROcodone (TUSSIONEX) 10-8 MG/5ML suspension 5 mL (5 mLs Oral Given 06/17/22 1105)    ED Course/ Medical Decision Making/ A&P  Medical Decision Making Amount and/or Complexity of Data Reviewed Labs: ordered. Radiology: ordered.  Risk Prescription drug management.   This patient presents to the ED for concern of cough, this involves an extensive number of treatment options, and is a complaint that carries with it a high risk of complications and morbidity.  The differential diagnosis includes covid, uri, pna   Co morbidities that complicate the patient evaluation  htn, hld, dm2, cva, gerd, and seasonal allergies   Additional history obtained:  Additional history obtained from epic chart review External records from outside source obtained and reviewed including husband   Lab Tests:  I Ordered, and personally interpreted labs.  The pertinent results include:  cbc with a hgb of 10.9, bmp with cr 1.72 and gfr 32 (chronic), covid +   Imaging Studies ordered:  I ordered imaging studies including cxr  I independently visualized and interpreted imaging which showed  IMPRESSION:  No active disease.   I agree with the radiologist interpretation   Cardiac Monitoring:  The patient was maintained on a cardiac monitor.  I personally viewed and interpreted the cardiac monitored which showed an underlying rhythm of: nsr   Medicines ordered and prescription drug management:  I ordered medication including tussionex  for cough  Reevaluation of the patient after these medicines showed that the patient improved I have reviewed the patients home medicines and have made adjustments as needed   Test Considered:  cxr   Problem List / ED Course:  Covid-19:  pt put on the low dose of paxlovid.  She is not toxic and not hypoxic.  She is stable for d/c and is to f/u with pcp.   Reevaluation:  After the  interventions noted above, I reevaluated the patient and found that they have :improved   Social Determinants of Health:  Lives at home   Dispostion:  After consideration of the diagnostic results and the patients response to treatment, I feel that the patent would benefit from discharge with outpatient f/u.    Jackie Phillips was evaluated in Emergency Department on 06/17/2022 for the symptoms described in the history of present illness. She was evaluated in the context of the global COVID-19 pandemic, which necessitated consideration that the patient might be at risk for infection with the SARS-CoV-2 virus that causes COVID-19. Institutional protocols and algorithms that pertain to the evaluation of patients at risk for COVID-19 are in a state of rapid change based on information released by regulatory bodies including the CDC and federal and state organizations. These policies and algorithms were followed during the patient's care in the ED.         Final Clinical Impression(s) / ED Diagnoses Final diagnoses:  COVID-19  Stage 3b chronic kidney disease ([place])    Rx / DC Orders ED Discharge Orders          Ordered    nirmatrelvir/ritonavir EUA, renal dosing, (PAXLOVID) 10 x 150 MG & 10 x 100MG  TABS  2 times daily        06/17/22 1155    chlorpheniramine-HYDROcodone (TUSSIONEX) 10-8 MG/5ML  Every 12 hours PRN        06/17/22 1155    predniSONE (DELTASONE) 50 MG tablet  Daily with breakfast        06/17/22 1155              Isla Pence, MD 06/17/22 1203

## 2022-06-17 NOTE — ED Notes (Signed)
Patient denies any shortness of breath. States that she coughed all night.

## 2023-03-26 ENCOUNTER — Other Ambulatory Visit (HOSPITAL_BASED_OUTPATIENT_CLINIC_OR_DEPARTMENT_OTHER): Payer: Self-pay

## 2023-03-26 MED ORDER — MOUNJARO 10 MG/0.5ML ~~LOC~~ SOAJ
10.0000 mg | SUBCUTANEOUS | 3 refills | Status: DC
  Filled 2023-03-26: qty 2, 28d supply, fill #0

## 2023-04-27 ENCOUNTER — Other Ambulatory Visit (HOSPITAL_BASED_OUTPATIENT_CLINIC_OR_DEPARTMENT_OTHER): Payer: Self-pay

## 2023-04-27 MED ORDER — MOUNJARO 12.5 MG/0.5ML ~~LOC~~ SOAJ
12.5000 mg | SUBCUTANEOUS | 3 refills | Status: DC
  Filled 2023-04-27: qty 2, 28d supply, fill #0
  Filled 2023-05-25: qty 2, 28d supply, fill #1
  Filled 2023-06-20: qty 2, 28d supply, fill #2
  Filled 2023-07-17: qty 2, 28d supply, fill #3

## 2023-05-25 ENCOUNTER — Other Ambulatory Visit: Payer: Self-pay

## 2023-08-14 ENCOUNTER — Other Ambulatory Visit (HOSPITAL_BASED_OUTPATIENT_CLINIC_OR_DEPARTMENT_OTHER): Payer: Self-pay

## 2023-08-16 ENCOUNTER — Other Ambulatory Visit (HOSPITAL_BASED_OUTPATIENT_CLINIC_OR_DEPARTMENT_OTHER): Payer: Self-pay

## 2023-08-16 MED ORDER — TIRZEPATIDE 12.5 MG/0.5ML ~~LOC~~ SOAJ
12.5000 mg | SUBCUTANEOUS | 3 refills | Status: AC
  Filled 2023-08-16: qty 2, 28d supply, fill #0

## 2023-09-16 ENCOUNTER — Other Ambulatory Visit (HOSPITAL_BASED_OUTPATIENT_CLINIC_OR_DEPARTMENT_OTHER): Payer: Self-pay

## 2023-09-16 MED ORDER — MOUNJARO 12.5 MG/0.5ML ~~LOC~~ SOAJ
12.5000 mg | SUBCUTANEOUS | 1 refills | Status: DC
  Filled 2023-09-16: qty 6, 84d supply, fill #0
